# Patient Record
Sex: Female | Born: 1935 | Race: White | Hispanic: No | Smoking: Never smoker
Health system: Southern US, Community
[De-identification: ages and names within clinical notes are randomized; demographics above are authoritative.]

## PROBLEM LIST (undated history)

## (undated) DIAGNOSIS — E7800 Pure hypercholesterolemia, unspecified: Secondary | ICD-10-CM

## (undated) DIAGNOSIS — I10 Essential (primary) hypertension: Secondary | ICD-10-CM

## (undated) DIAGNOSIS — G4700 Insomnia, unspecified: Secondary | ICD-10-CM

## (undated) DIAGNOSIS — E785 Hyperlipidemia, unspecified: Secondary | ICD-10-CM

## (undated) DIAGNOSIS — R451 Restlessness and agitation: Secondary | ICD-10-CM

## (undated) DIAGNOSIS — F0280 Dementia in other diseases classified elsewhere without behavioral disturbance: Secondary | ICD-10-CM

## (undated) DIAGNOSIS — G3183 Dementia with Lewy bodies: Secondary | ICD-10-CM

## (undated) HISTORY — DX: Insomnia, unspecified: G47.00

## (undated) HISTORY — DX: Restlessness and agitation: R45.1

## (undated) HISTORY — DX: Dementia with Lewy bodies: G31.83

## (undated) HISTORY — DX: Hyperlipidemia, unspecified: E78.5

## (undated) HISTORY — DX: Dementia in other diseases classified elsewhere without behavioral disturbance: F02.80

## (undated) HISTORY — DX: Essential (primary) hypertension: I10

## (undated) HISTORY — DX: Pure hypercholesterolemia, unspecified: E78.00

---
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
## 2001-10-13 ENCOUNTER — Inpatient Hospital Stay (HOSPITAL_COMMUNITY): Admission: AD | Admit: 2001-10-13 | Discharge: 2001-10-16 | Payer: Self-pay | Admitting: Cardiology

## 2001-10-14 ENCOUNTER — Encounter: Payer: Self-pay | Admitting: Cardiology

## 2001-10-14 ENCOUNTER — Encounter: Payer: Self-pay | Admitting: Internal Medicine

## 2001-10-16 ENCOUNTER — Encounter: Payer: Self-pay | Admitting: Cardiology

## 2002-08-27 HISTORY — PX: REPLACEMENT TOTAL KNEE: SUR1224

## 2003-03-25 ENCOUNTER — Other Ambulatory Visit: Admission: RE | Admit: 2003-03-25 | Discharge: 2003-03-25 | Payer: Self-pay | Admitting: Dermatology

## 2003-06-01 ENCOUNTER — Ambulatory Visit (HOSPITAL_COMMUNITY): Admission: RE | Admit: 2003-06-01 | Discharge: 2003-06-01 | Payer: Self-pay | Admitting: Internal Medicine

## 2004-05-26 ENCOUNTER — Encounter: Admission: RE | Admit: 2004-05-26 | Discharge: 2004-05-26 | Payer: Self-pay | Admitting: Cardiology

## 2004-09-07 ENCOUNTER — Ambulatory Visit (HOSPITAL_COMMUNITY): Admission: RE | Admit: 2004-09-07 | Discharge: 2004-09-07 | Payer: Self-pay | Admitting: Urology

## 2005-05-24 ENCOUNTER — Encounter: Admission: RE | Admit: 2005-05-24 | Discharge: 2005-05-24 | Payer: Self-pay | Admitting: Cardiology

## 2006-05-24 ENCOUNTER — Encounter: Admission: RE | Admit: 2006-05-24 | Discharge: 2006-05-24 | Payer: Self-pay | Admitting: Cardiology

## 2007-07-14 ENCOUNTER — Encounter: Admission: RE | Admit: 2007-07-14 | Discharge: 2007-07-14 | Payer: Self-pay | Admitting: Cardiology

## 2009-06-07 ENCOUNTER — Encounter: Admission: RE | Admit: 2009-06-07 | Discharge: 2009-06-07 | Payer: Self-pay | Admitting: Cardiology

## 2009-08-27 HISTORY — PX: TOTAL SHOULDER REPLACEMENT: SUR1217

## 2010-08-27 HISTORY — PX: PACEMAKER INSERTION: SHX728

## 2010-08-27 HISTORY — PX: CATARACT EXTRACTION: SUR2

## 2010-09-07 ENCOUNTER — Encounter: Payer: Self-pay | Admitting: Internal Medicine

## 2010-09-11 ENCOUNTER — Encounter: Payer: Self-pay | Admitting: Internal Medicine

## 2010-09-12 ENCOUNTER — Encounter: Payer: Self-pay | Admitting: Internal Medicine

## 2010-09-14 ENCOUNTER — Encounter: Payer: Self-pay | Admitting: Internal Medicine

## 2010-09-14 ENCOUNTER — Ambulatory Visit (HOSPITAL_COMMUNITY)
Admission: RE | Admit: 2010-09-14 | Discharge: 2010-09-14 | Payer: Self-pay | Source: Home / Self Care | Attending: Internal Medicine | Admitting: Internal Medicine

## 2010-09-18 LAB — CBC
HCT: 39.2 % (ref 36.0–46.0)
Hemoglobin: 12.1 g/dL (ref 12.0–15.0)
MCH: 28 pg (ref 26.0–34.0)
MCHC: 30.9 g/dL (ref 30.0–36.0)

## 2010-09-18 LAB — BASIC METABOLIC PANEL
BUN: 9 mg/dL (ref 6–23)
Chloride: 105 mEq/L (ref 96–112)
Glucose, Bld: 104 mg/dL — ABNORMAL HIGH (ref 70–99)
Potassium: 4.2 mEq/L (ref 3.5–5.1)

## 2010-09-18 LAB — PROTIME-INR: INR: 1.06 (ref 0.00–1.49)

## 2010-09-28 NOTE — Cardiovascular Report (Signed)
Summary: Office Visit   Office Visit   Imported By: Roderic Ovens 09/18/2010 11:14:06  _____________________________________________________________________  External Attachment:    Type:   Image     Comment:   External Document

## 2010-10-04 NOTE — Letter (Signed)
Summary: Dr. Leeroy Bock Tilley's Office Note  Dr. Leeroy Bock Tilley's Office Note   Imported By: Marylou Mccoy 09/27/2010 11:32:17  _____________________________________________________________________  External Attachment:    Type:   Image     Comment:   External Document

## 2010-10-16 NOTE — Op Note (Signed)
  Amanda Rivera, Amanda Rivera                 ACCOUNT NO.:  [id number]  MEDICAL RECORD NO.:  [id number]          PATIENT TYPE:  OIB  LOCATION:  2899                         FACILITY:  [facility]  PHYSICIAN:  Duke Salvia, MD, FACCDATE OF BIRTH:  [birth year]  DATE OF PROCEDURE:  09/14/2010 DATE OF DISCHARGE:  09/14/2010                              OPERATIVE REPORT   PREOPERATIVE DIAGNOSIS:  Previously implanted pacemaker for syncope and sinus node dysfunction, now at end-of-life.  POSTOPERATIVE DIAGNOSIS:  Previously implanted pacemaker for syncope and sinus node dysfunction, now at end-of-life.  PROCEDURE:  Explantation of previously implanted device, pocket revision, insertion of a new device.  Following obtaining the informed consent, the patient was brought to the Electrophysiology Laboratory and placed on the fluoroscopic table in supine position.  After routine prep and drape, lidocaine was infiltrated just below the previous incision and carried down to the layer of the prepectoral fascia using electrocautery and sharp dissection.  The pocket was opened.  The device was freed up and device was explanted.  Interrogation of the previously implanted atrial lead demonstrated an amplitude of 2.3 with a pace impedance of 40, threshold of 0.4 at 0.5.  The R-wave had an amplitude of 12.5 with a pace impedance of 501, a threshold of 0.9 at 0.5.  The leads were inspected. The leads were then attached to a Adapta RL pulse generator, serial number [id number].  The device was then placed in the pocket and secured to the prepectoral fascia.  Hemostasis having been obtained and the pocket having been extended caudally by about a centimeter and a half to allow housing of the larger generator.  The pocket was copiously irrigated with antibiotic-containing saline solution.  Hemostasis was assured, and the wound was closed in three layers in normal fashion. The wound was washed, dried, and Benzoin and  Steri-Strip dressing was applied.  Needle counts, sponge counts, and instrument counts were correct at the end of the procedure according to the staff.  The patient tolerated the procedure without apparent complication.     Duke Salvia, MD, [facility]     SCK/MEDQ  D:  09/14/2010  T:  09/15/2010  Job:  [id number]  Electronically Signed by Sherryl Manges MD [facility] on 10/16/2010 09:50:22 PM

## 2010-10-18 NOTE — Cardiovascular Report (Signed)
Summary: Pre-Op Orders  Pre-Op Orders   Imported By: Marylou Mccoy 10/10/2010 14:49:15  _____________________________________________________________________  External Attachment:    Type:   Image     Comment:   External Document

## 2011-01-10 ENCOUNTER — Encounter: Payer: Self-pay | Admitting: *Deleted

## 2011-01-12 NOTE — Cardiovascular Report (Signed)
NAMEMATTISEN, POHLMANN                             ACCOUNT NO.:  [id number]   MEDICAL RECORD NO.:  [id number]                   PATIENT TYPE:  INP   LOCATION:                                       FACILITY:  [facility]   PHYSICIAN:  Learta Codding, M.D.                 DATE OF BIRTH:  26-Feb-1921   DATE OF PROCEDURE:  DATE OF DISCHARGE:  10/16/2001                              CARDIAC CATHETERIZATION   PRIMARY CARE PHYSICIAN:  Dr. Doreen Beam.   PROCEDURE PERFORMED:  Left heart catheterization, ventriculography.   INDICATION:  The patient is a 77 year old female referred for cardiac  catheterization to rule out significant coronary artery disease.   DESCRIPTION OF PROCEDURE:  Informed consent was obtained.  The patient was  brought to the catheterization laboratory.  6-French arterial catheters were  used to access right femoral artery using the modified Seldinger technique.  No complications were encountered during the procedure.   FINDINGS:   HEMODYNAMICS:  Left ventricular pressure 150/55 mmHg, aortic pressure 120/55  mmHg, gradient across the aortic valve mean of 12 mmHg with peak gradient of  20 mmHg.   VENTRICULOGRAPHY:  Ejection fraction 70%.  No wall motion abnormalities.   SELECTIVE CORONARY ANGIOGRAPHY:  1. Left main coronary artery:  No evidence of flow-liming disease.  2. Circulation was right dominant.  3. Left anterior descending artery had a proximal 30% diffuse stenosis.     There was a lesion of approximately 40% in the mid vessel and a distal     40% lesion.  4. Diagonal branches were free of flow-limiting disease.  5. Ramus branch had proximal and ostial 30% lesion.  6. Circumflex was diffusely diseased in its proximal segment with     significant stenosis of approximately 20%.  7. The right coronary artery had no evidence of flow-limiting disease.   RECOMMENDATIONS:  The patient demonstrates nonobstructive coronary artery  disease.  Further medical therapy is  recommended.  A CBC will be obtained  later this evening to rule out retroperitoneal hematoma.  No complications  otherwise encountered.                                               Learta Codding, M.D.    GED/MEDQ  D:  09/12/2003  T:  09/12/2003  Job:  [id number]

## 2011-01-12 NOTE — Op Note (Signed)
[place]  Patient:    Amanda Rivera, Amanda Rivera Visit Number: [id number] MRN: [id number]          Service Type: MED Location: CCUA 2931 01 Attending Physician:  Ronaldo Miyamoto Dictated by:   Nathen May, M.D., [facility] Proc. Date: 10/15/01 Admit Date:  10/13/2001   CC:         Dr. Oleh Genin  [facility] - Attention:  Device Clinic  Electrophysiology Laboratory   Operative Report  PREOPERATIVE DIAGNOSIS:  Sinus node dysfunction with asystolic arrest and syncope.  POSTOPERATIVE DIAGNOSIS:  Sinus node dysfunction with asystolic arrest and syncope.  PROCEDURE:  Contrast venography and dual chamber pacemaker implantation.  SURGEON:  Nathen May, M.D., [facility].  DESCRIPTION OF PROCEDURE:  Following obtaining informed consent, the patient was brought to the electrophysiology laboratory, and placed on the fluoroscopic table in the supine position.  After routine prep and drape to the left upper chest, intravenous contrast was injected via the left antecubital vein to identify the course and the patency to the extrathoracic left subclavian vein.  This having been accomplished, lidocaine was infiltrated into the prepectoral subclavicular region.  An incision was made and carried down to the layer of the prepectoral fascia using electrocautery.  A pocket was formed using electrocautery.  Hemostasis was obtained.  Thereafter, attention was turned to gaining access to the extrathoracic left subclavian vein which was accomplished without difficulty, and without the aspiration of air or puncture to the artery.  Two guidewires were placed and a retaining 0 silk suture was placed in a figure-of-eight fashion and allowed to hang loosely.  Sequentially, a 7-French tear-away introducer sheaths were placed which allowed for the passage of two Medtronic active fixation leads, the first was a model 5076, 52 cm in  length, marked with a tie, serial #[id number].  Under fluoroscopic guidance, it was manipulated to the right ventricular apex where the bipolar R-wave was 13.9 mV with a pacing threshold of 0.6 V at 0.5 msec. Currented threshold was 1.0 mA and a pacing impedance was 817 ohms.  There was no diaphragmatic pacing at 10 V.  This lead was then secured to the prepectoral fascia and the Medtronic 5076, 45 cm length lead, serial #[id number] was then passed under fluoroscopic guidance to the right atrial appendage where the bipolar P-wave was 2.2 mV and pacing threshold was 1.6 V at 0.5 msec.  Currented threshold was 3.3 mA and pacing impedance was 616 ohms.  This lead was then secured to the prepectoral fascia as well, and the hemostasis surgically was secured.  The leads were then attached to Medtronic Kappa KDR 801 pulse generator, serial #[id number]. Ventricular and then A-V pacing were identified.  Hemostasis was obtained and the pocket was copiously irrigated with antibiotic containing saline solution. The leads in the pulse generator were then placed in the pocket and secured to the prepectoral fascia.  The wound was then closed in three layers in the normal fashion.  The wound was washed, dried, and a Benzoin and Steri-Strips dressing was then applied. Needle counts, sponge counts, and instruments counts were correct at the end of the procedure according to the staff. Dictated by:   Nathen May, M.D., [facility] Attending Physician:  Ronaldo Miyamoto DD:  10/15/01 TD:  10/15/01 Job: [id number]

## 2011-01-12 NOTE — Op Note (Signed)
NAMEESTEFANIA, [name]                 ACCOUNT NO.:  [id number]   MEDICAL RECORD NO.:  [id number]          PATIENT TYPE:  AMB   LOCATION:  DAY                          FACILITY:  [facility]   PHYSICIAN:  Thyra Breed, MD         DATE OF BIRTH:  Aug 01, 1921   DATE OF PROCEDURE:  09/07/2004  DATE OF DISCHARGE:                                 OPERATIVE REPORT   PREOPERATIVE DIAGNOSIS:  Stress urinary incontinence.   POSTOPERATIVE DIAGNOSIS:  Stress urinary incontinence.   PROCEDURE PERFORMED:  1.  Transobturator sling placement Conservation officer, historic buildings).  2.  Cystoscopy.   ATTENDING SURGEON:  Sigmund I. Patsi Sears, M.D.   RESIDENT SURGEON:  Thyra Breed, M.D.   ANESTHESIA:  General endotracheal.   DRAINS:  16-French Foley catheter to straight drainage.   ESTIMATED BLOOD LOSS:  Minimal.   COMPLICATIONS:  None.   INDICATIONS FOR PROCEDURE:  [name] is a pleasant 77 year old female who  suffers from stress urinary incontinence. Specifically, she uses urine with  coughing, laughing and sneezing. On physical exam, she has a mild cystocele  with significant urethral hypermobility. Several options for treatment were  discussed with Ms. Rivera, and she wishes to proceed with surgical  intervention. Therefore, she was brought to the operating room to undergo  transobturator sling placement. All risks, benefits, and alternatives had  been described in detail, and the patient is willing to proceed. Informed  consent was obtained.   PROCEDURE IN DETAIL:  Following identification by her arm bracelet, the  patient was brought to the operating room and placed in a supine position.  Here, she received preoperative IV antibiotics and underwent successful  induction of general endotracheal anesthesia. She was then moved to the  dorsal lithotomy position. Her perineum and genitalia as well as vaginal  vault were then prepped with Betadine and draped in the usual sterile  fashion. We placed a weighted vaginal  speculum in the vagina revealing the  anterior vaginal vault. There was a questionable grade 1 enterocele versus  cystocele deep in the anterior vaginal vault. We placed a 16-French Foley  catheter to straight drainage and emptied the bladder in its entirety. A T  clamp was used to grasp the distal aspect of the urethra. Of note, her  posterior lip of her urethra did have a small caruncle. The 11-blade scalpel  was then use to make an incision. Marcaine 0.25% with epinephrine was then  used to inject the anterior vaginal mucosa prior to incision. An 11-blade  scalpel was then used to create an approximately 2-cm incision in the  midline just overlying the urethra. Strully scissors and pickups were then  used to carefully dissect a plane far lateral to the urethra on either side  of the midline. The resulting opening was just large enough to fit the small  finger of the surgeon. The pubic bone was palpable easily on either side of  the urethra.  We then used a ruler to mark 5 cm from the clitoris on either side of the  midline in the area of the obturator  fossa. Two stab incisions were then  made with the 15-blade scalpel. The curved needle passer was then passed  through both stab incisions far lateral to the urethra with a surgeon's  finger guiding the needle through the incision made in the anterior vaginal  mucosa. The sling was affixed to the needle passers, and the needle passers  brought back through the stab incisions. At this time, the Foley catheter  was removed. We performed cystoscopy to ensure there was damage to the  bladder during passage of the needles. Using a 70-degree cystoscopic lens  inserted transurethrally, there appeared to be no damage to the urethra as  it was normal in its appearance. Both ureteral orifices were identified in  the normal anatomic location. Further inspection displayed some indention of  the anterior bladder wall, most likely from uterine origin.  There was no  evidence of mucosa irregularities, foreign bodies, tumors or stones within  the bladder. The cystoscope was then removed and drained in its entirety.  The Foley catheter was reinserted into the bladder. The sling was then  tightened as a right angled clamp was left to ensure the sling was not too  tight. Once we were pleased with the tension, the outer sheath of the sling  was removed. Excess sling material was trimmed above the stab incisions. The  right inguinal clamp was removed. The wound was copiously irrigated with  antibiotic solution. Three-0 Vicryl was then used to close the anterior  vaginal wall incision over the sling. Foley catheter was left in place. The  stab incisions were washed, dried, and Dermabond applied to complete the  closure. The patient tolerated the procedure well, and there were no  complications.   Dr. Lynelle Smoke I. Patsi Sears was present and participated in the entire  procedure as he was the responsible Careers adviser.   DISPOSITION:  After awakening from general anesthesia, the patient was  transferred to the post-anesthesia care unit in stable condition. From here,  her Foley catheter would be removed, and she will be given a trial of  voiding. She can then be discharged to home. She is given a prescription for  Percocet #30 as well as a one week course of Cipro 250 b.i.d.      EG/MEDQ  D:  09/07/2004  T:  09/07/2004  Job:  [id number]
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
## 2011-01-12 NOTE — Op Note (Signed)
NAMEKENNIDI, YOSHIDA                             ACCOUNT NO.:  [id number]   MEDICAL RECORD NO.:  [id number]                   PATIENT TYPE:  AMB   LOCATION:  DAY                                  FACILITY:  [facility]   PHYSICIAN:  Lionel December, M.D.                 DATE OF BIRTH:  18-Aug-1921   DATE OF PROCEDURE:  06/01/2003  DATE OF DISCHARGE:                                 OPERATIVE REPORT   PROCEDURE:  Total colonoscopy.   ENDOSCOPIST:  Lionel December, M.D.   INDICATIONS:  Amanda Rivera is an 77 year old Caucasian female who is undergoing  colonoscopy primarily for screening purposes but she does complain of recent  onset of constipation and intermittent right lower quadrant pain.  She has  not had any rectal bleeding.  The procedure was reviewed with the patient.  Informed consent was obtained.   PREOPERATIVE MEDICATIONS:  Demerol 25 mg IV, Versed 2 mg IV in divided  doses.   DESCRIPTION OF PROCEDURE:  The procedure was performed in the endoscopy  suite.  The patient's vital signs and O2 saturation were monitored during  the procedure and remained stable.  The patient was placed in the left  lateral recumbent position.  Rectal examination was performed.  No  abnormality noted on external digital examination.   The scope was placed in the rectum and advanced under vision into the  sigmoid colon and beyond.  Preparation was excellent until the hepatic  flexure and she has some stool coating her cecum and ascending colon.  She  has scattered diverticula of the sigmoid colon and two tiny runs at the  hepatic flexure.  The scope was passed to the cecum which was identified by  the ileocecal valve.  The blunt end of the cecum was well seen and was  normal.  The appendiceal stump was also seen and photographed for the  record.  As the scope was withdrawn, colonic mucosa was once again carefully  examined.  There were no polyps, tumor masses or stricture.  The perirectal  mucosa was normal.   The scope was retroflexed to examine the anorectal junction which was  unremarkable.  The endoscope was straightened and withdrawn.   The patient tolerated the procedure well.   FINAL DIAGNOSIS:  Sigmoid colon diverticulosis along with a few more  diverticula at the hepatic flexure; otherwise, normal examination.    RECOMMENDATIONS:  She will continue a high-fiber diet and try Colace two  tablets at bedtime or Lactulose 1-2 tablespoonfuls at bedtime.  However, I  would also like her to have a TSH by Dr. Sherril Croon unless she has had it recently  along with serum calcium.      ___________________________________________  Lionel December, M.D.   NR/MEDQ  D:  06/01/2003  T:  06/02/2003  Job:  [id number]   cc:   Ignatius Specking, M.D.

## 2011-08-28 HISTORY — PX: BLADDER SURGERY: SHX569

## 2012-05-17 DIAGNOSIS — R0602 Shortness of breath: Secondary | ICD-10-CM

## 2012-07-18 IMAGING — CR DG CHEST 2V
2 series · 2 of 2 positions shown · non-contrast
Comparison: 06/07/2009

CLINICAL DATA: Pacemaker generator change.

CHEST - 2 VIEW

[view not recorded (1 of 2)]
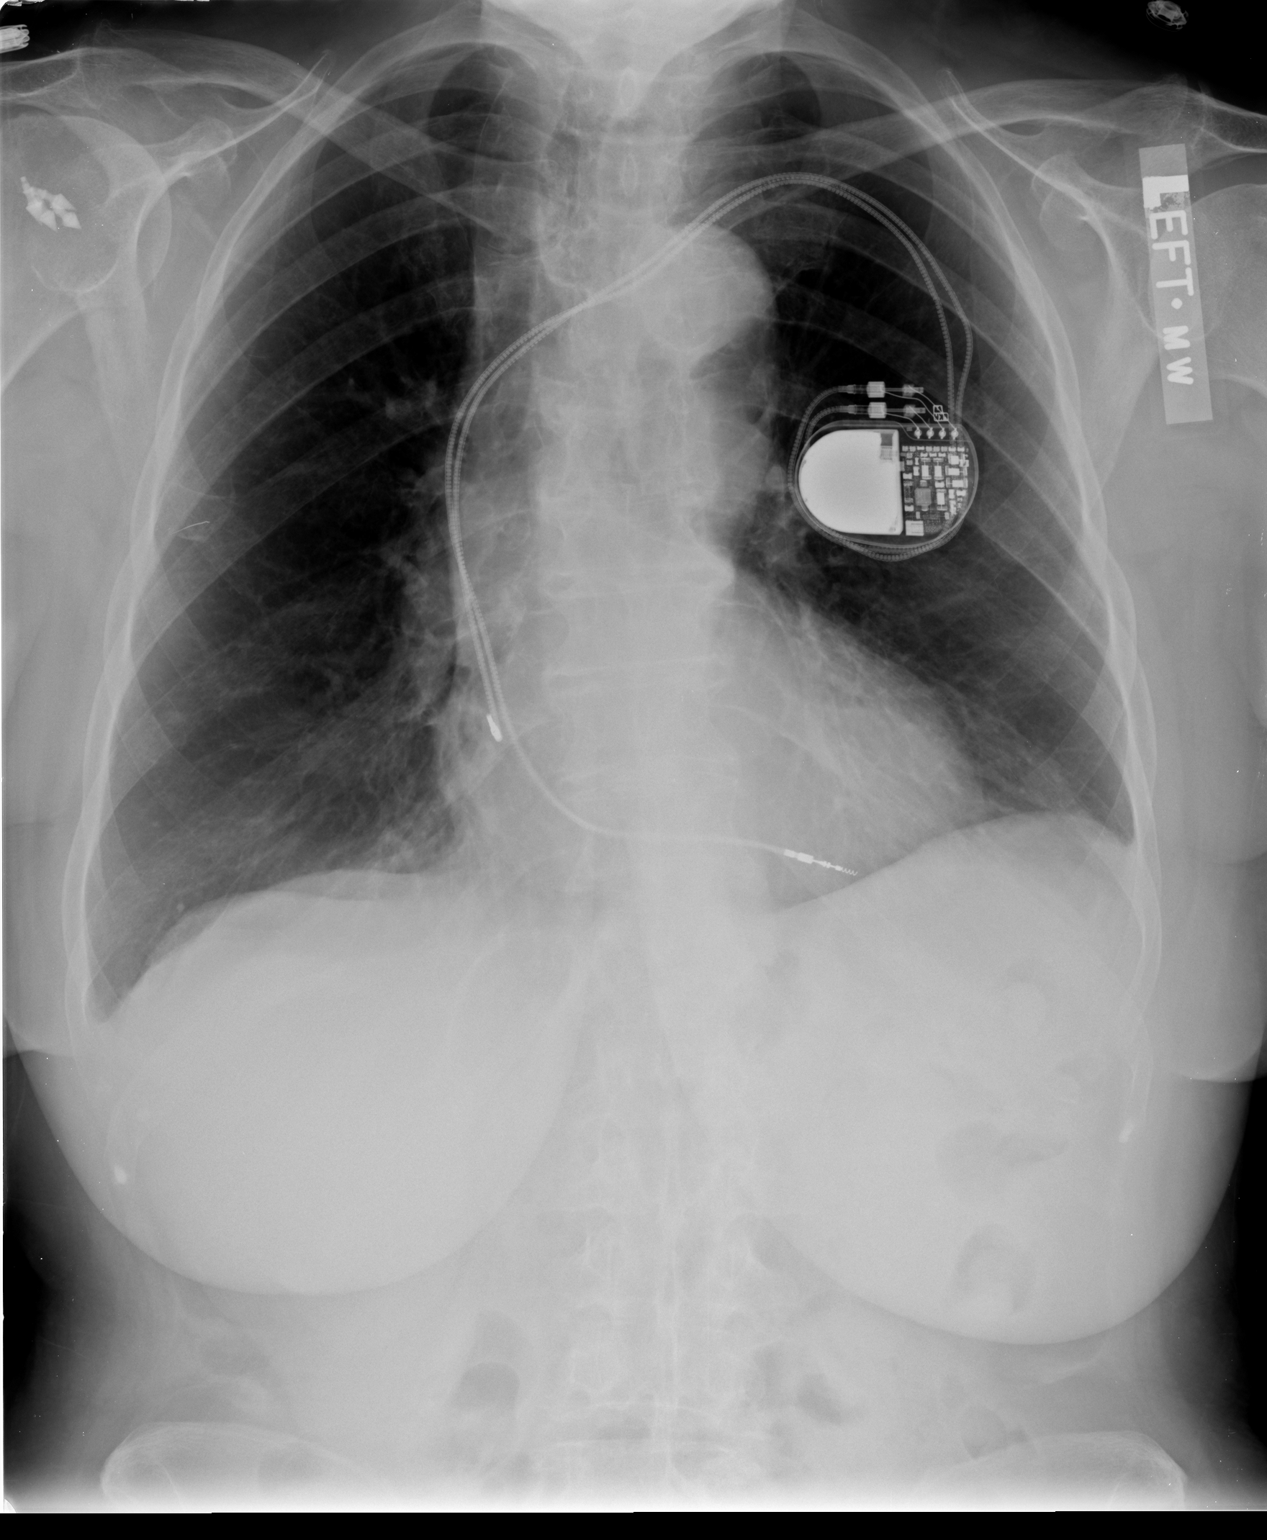

[view not recorded (2 of 2)]
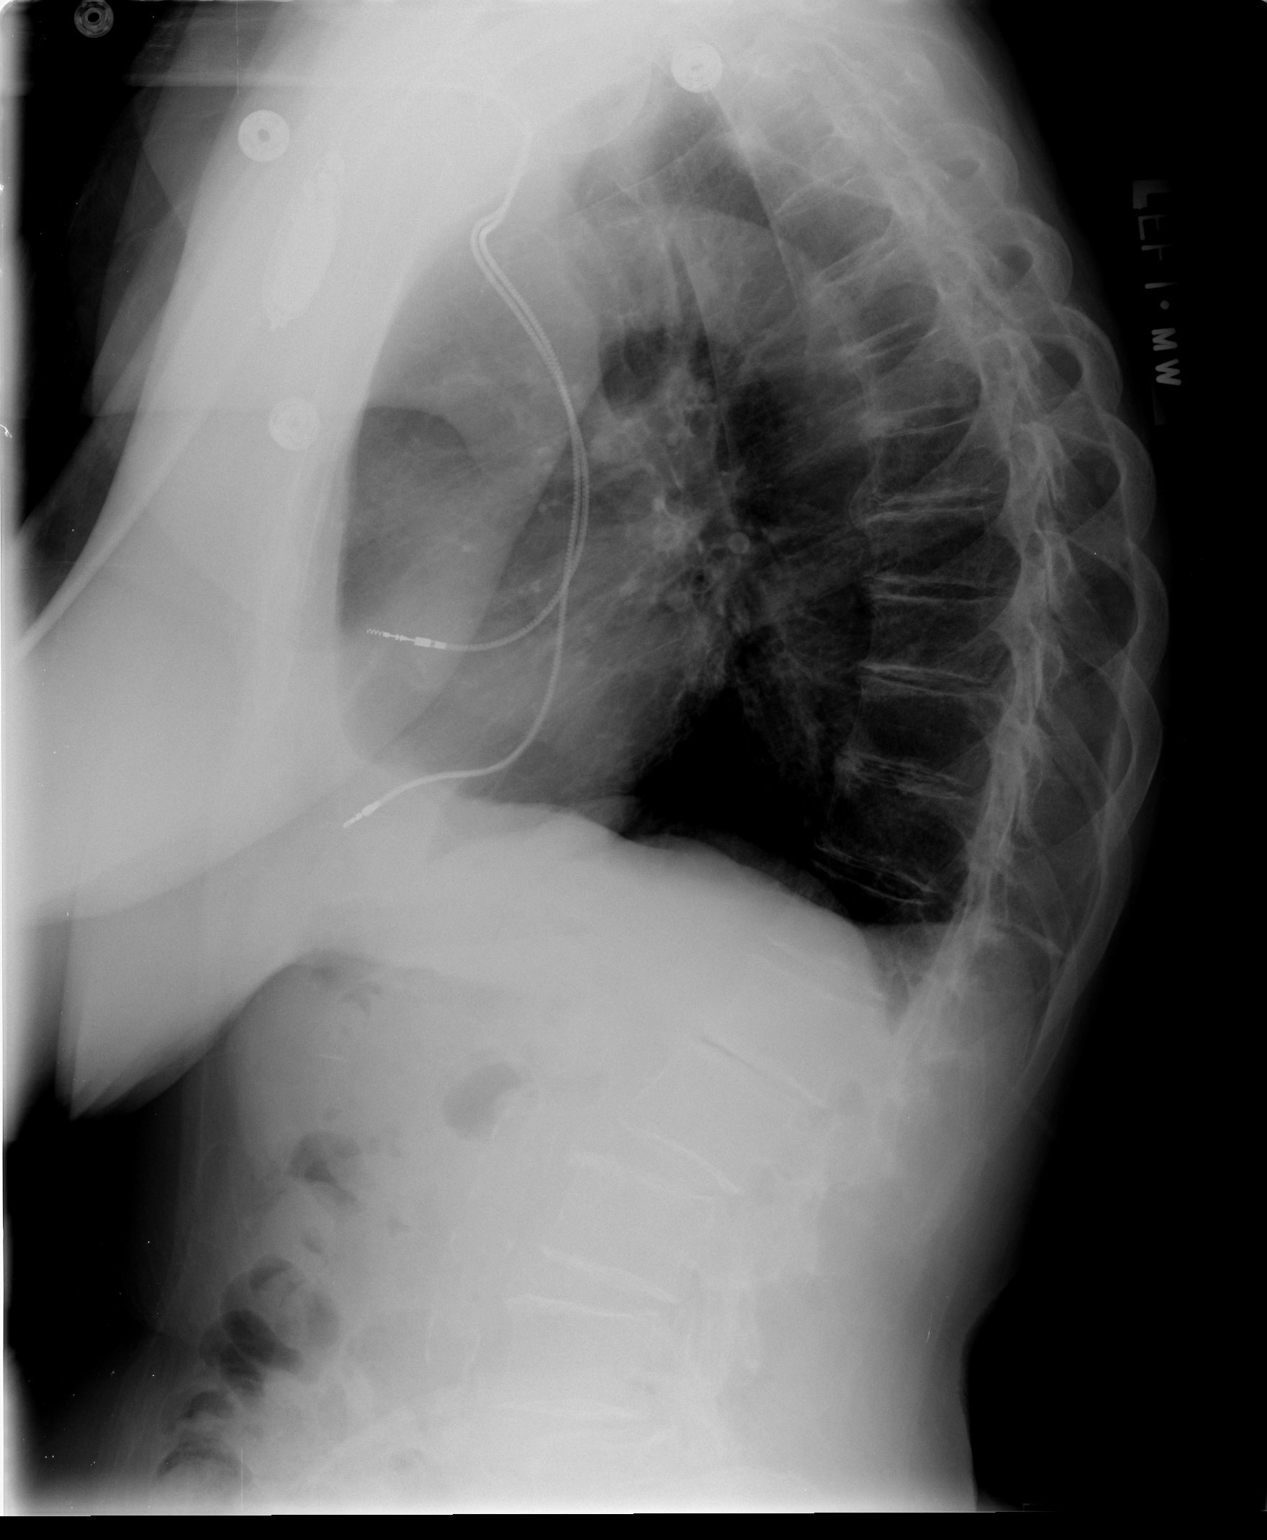

[2 of 2 positions shown; findings below may reference images not displayed]

FINDINGS: Dual lead pacer is in place.  Heart size and vascularity
are normal.  Lungs are clear. Except for minimal scarring at the
bases.  No acute osseous abnormality.
IMPRESSION: No acute abnormalities.

## 2012-08-27 HISTORY — PX: ANKLE SURGERY: SHX546

## 2014-08-06 ENCOUNTER — Encounter: Payer: Self-pay | Admitting: *Deleted

## 2014-08-06 ENCOUNTER — Encounter: Payer: Self-pay | Admitting: Internal Medicine

## 2014-08-06 ENCOUNTER — Ambulatory Visit (INDEPENDENT_AMBULATORY_CARE_PROVIDER_SITE_OTHER): Payer: Medicare Other | Admitting: Internal Medicine

## 2014-08-06 VITALS — BP 120/70 | HR 66 | Temp 97.6°F | Resp 18

## 2014-08-06 DIAGNOSIS — G4700 Insomnia, unspecified: Secondary | ICD-10-CM

## 2014-08-06 DIAGNOSIS — F0280 Dementia in other diseases classified elsewhere without behavioral disturbance: Secondary | ICD-10-CM

## 2014-08-06 DIAGNOSIS — Z950 Presence of cardiac pacemaker: Secondary | ICD-10-CM

## 2014-08-06 DIAGNOSIS — E785 Hyperlipidemia, unspecified: Secondary | ICD-10-CM

## 2014-08-06 DIAGNOSIS — R451 Restlessness and agitation: Secondary | ICD-10-CM

## 2014-08-06 DIAGNOSIS — G3183 Dementia with Lewy bodies: Secondary | ICD-10-CM

## 2014-08-06 HISTORY — DX: Restlessness and agitation: R45.1

## 2014-08-06 HISTORY — DX: Neurocognitive disorder with Lewy bodies: G31.83

## 2014-08-06 HISTORY — DX: Hyperlipidemia, unspecified: E78.5

## 2014-08-06 HISTORY — DX: Insomnia, unspecified: G47.00

## 2014-08-06 HISTORY — DX: Dementia in other diseases classified elsewhere, unspecified severity, without behavioral disturbance, psychotic disturbance, mood disturbance, and anxiety: F02.80

## 2014-08-06 MED ORDER — MIRTAZAPINE 7.5 MG PO TABS: 7.5000 mg | ORAL_TABLET | Freq: Every day | ORAL | Status: DC

## 2014-08-06 NOTE — Patient Instructions (Signed)
alzheimer

## 2014-08-06 NOTE — Progress Notes (Signed)
Subjective:    Patient ID: Amanda Rivera, female    DOB: 1921/03/31, 80 y.o.   MRN: [id number]  HPI  80 yo female presents for new patient visit. POA, her daughter, is c/a medications. Hx Alzheimer's on no meds and has tried "everything". She has agitation that is sometimes "really bad". Occasionally has to crush meds into food. She does not sleep well with frequent night awakenings. Occasionally gives her an extra dose of doxepin and clonopin which helps. She has visual hallucinations and screams a lot when she does not see her daughter. Pt lives with daughter and son-in-law.  Past  meds tried:   - Zolpidem,  temazepam, trazadone, lorazepam for sleep with no relief.  - Alprazolam, haloperidol, risperdol, requip, seroquel for agitation with no relief.  - Namenda (not helpful) and aricept (caused nausea) for dementia. Daughter recalls seeing a specialist in the past but does not remember if it was a neurologist.  Pt uses hospital bed. Sleeps with electric blanket. Takes sponge bath nightly and a bath twice weekly. Applies monkey paste in skin folds to prevent yeast. Rarely needs nystatin powder.Daughter applies nystatin soln to corner of pt's mouth at night as she sleeps with her mouth open and it causes dryness. Requires much assistance with ambulation. Sits in wheelchair or chair most of the day.    Review of Systems  Constitutional: Negative for fever, chills, activity change, appetite change and unexpected weight change.  HENT: Negative for hearing loss, mouth sores and trouble swallowing.   Eyes: Negative for visual disturbance.  Respiratory: Negative for shortness of breath.   Cardiovascular: Negative for chest pain and leg swelling.  Gastrointestinal: Negative for vomiting, abdominal pain and blood in stool.  Genitourinary: Negative for dysuria and frequency.  Skin: Negative for rash.  Neurological: Negative for tremors, seizures and weakness.  Psychiatric/Behavioral: Positive for  hallucinations, confusion, sleep disturbance and agitation.       Objective:   Physical Exam  Constitutional: She appears well-nourished. No distress.  frail appearing, sitting in w/c  Eyes: Pupils are equal, round, and reactive to light.  Neck: Carotid bruit is not present. No thyromegaly present.  Cardiovascular: Normal rate, regular rhythm and intact distal pulses.  Exam reveals no gallop and no friction rub.   No murmur heard. Palpable left ACW pacer  Pulmonary/Chest: Effort normal and breath sounds normal. No respiratory distress. She has no wheezes. She has no rales.  Abdominal: Soft. Bowel sounds are normal. She exhibits no distension. There is no tenderness. There is no rebound and no guarding.  Lymphadenopathy:    She has no cervical adenopathy.  Neurological: She is alert.  Skin: Skin is warm and dry. No rash noted.  Psychiatric: Her mood appears anxious. Her speech is slurred. She is agitated. Cognition and memory are impaired.  Oriented to person only  Vitals reviewed.         Assessment & Plan:     ICD-9-CM ICD-10-CM   1. Dementia with Lewy bodies 331.82 G31.83 CMP    F02.80 CBC with Differential  2. Insomnia 780.52 G47.00 mirtazapine (REMERON) 7.5 MG tablet  3. Restlessness and agitation 799.29 R45.1 CMP   307.9  CBC with Differential  4. Pacemaker V45.01 Z95.0   5. Hyperlipidemia 272.4 E78.5 Lipid Panel    Orders Placed This Encounter  Procedures  . CMP  . CBC with Differential  . Lipid Panel   - trial remeron 7.5 mg qhs to help sleep. Continue prn clonazepam for agitation. STOP  doxepin. POA declines med for dementia  - f/u in 1 month to reck or sooner if need be.  - obtain old records from previous PCP. Dr Donnie Ahoilley is her cardiologist and mx pacer.  - she is UTD on immunizations  - continue all other meds as Rx

## 2014-08-07 LAB — CBC WITH DIFFERENTIAL/PLATELET
BASOS ABS: 0 10*3/uL (ref 0.0–0.2)
Basos: 0 %
EOS ABS: 0.1 10*3/uL (ref 0.0–0.4)
Eos: 3 %
HCT: 39.2 % (ref 34.0–46.6)
Hemoglobin: 12.7 g/dL (ref 11.1–15.9)
IMMATURE GRANS (ABS): 0 10*3/uL (ref 0.0–0.1)
IMMATURE GRANULOCYTES: 0 %
LYMPHS: 23 %
Lymphocytes Absolute: 1.2 10*3/uL (ref 0.7–3.1)
MCH: 29.5 pg (ref 26.6–33.0)
MCHC: 32.4 g/dL (ref 31.5–35.7)
MCV: 91 fL (ref 79–97)
MONOCYTES: 6 %
Monocytes Absolute: 0.3 10*3/uL (ref 0.1–0.9)
NEUTROS PCT: 68 %
Neutrophils Absolute: 3.7 10*3/uL (ref 1.4–7.0)
RBC: 4.31 x10E6/uL (ref 3.77–5.28)
RDW: 14.6 % (ref 12.3–15.4)
WBC: 5.3 10*3/uL (ref 3.4–10.8)

## 2014-08-07 LAB — LIPID PANEL
CHOLESTEROL TOTAL: 119 mg/dL (ref 100–199)
Chol/HDL Ratio: 3.1 ratio units (ref 0.0–4.4)
HDL: 38 mg/dL — ABNORMAL LOW (ref >39)
LDL CALC: 70 mg/dL (ref 0–99)
Triglycerides: 54 mg/dL (ref 0–149)
VLDL Cholesterol Cal: 11 mg/dL (ref 5–40)

## 2014-08-07 LAB — COMPREHENSIVE METABOLIC PANEL
A/G RATIO: 1.6 (ref 1.1–2.5)
ALK PHOS: 116 IU/L (ref 39–117)
ALT: 13 IU/L (ref 0–32)
AST: 19 IU/L (ref 0–40)
Albumin: 3.8 g/dL (ref 3.2–4.6)
BUN / CREAT RATIO: 25 (ref 11–26)
BUN: 22 mg/dL (ref 10–36)
CO2: 23 mmol/L (ref 18–29)
CREATININE: 0.88 mg/dL (ref 0.57–1.00)
Calcium: 9 mg/dL (ref 8.7–10.3)
Chloride: 107 mmol/L (ref 97–108)
GFR calc Af Amer: 66 mL/min/{1.73_m2} (ref >59)
GFR, EST NON AFRICAN AMERICAN: 58 mL/min/{1.73_m2} — AB (ref >59)
GLOBULIN, TOTAL: 2.4 g/dL (ref 1.5–4.5)
Glucose: 111 mg/dL — ABNORMAL HIGH (ref 65–99)
POTASSIUM: 4.1 mmol/L (ref 3.5–5.2)
SODIUM: 146 mmol/L — AB (ref 134–144)
Total Bilirubin: 0.2 mg/dL (ref 0.0–1.2)
Total Protein: 6.2 g/dL (ref 6.0–8.5)

## 2014-08-11 ENCOUNTER — Telehealth: Payer: Self-pay | Admitting: *Deleted

## 2014-08-11 MED ORDER — MIRTAZAPINE 15 MG PO TABS: 15.0000 mg | ORAL_TABLET | Freq: Every day | ORAL | Status: DC

## 2014-08-11 NOTE — Telephone Encounter (Signed)
Caregiver also stated that last night gave her 2 anxiety pills at 4:30am this morning and still has not slept. They have tried Ambien in the past and it didn't work.

## 2014-08-11 NOTE — Telephone Encounter (Signed)
Patient caregiver has 15mg  tablets and will give her a whole tablet at bedtime. She will let us know how this works.

## 2014-08-11 NOTE — Telephone Encounter (Signed)
Patient caregiver, [name] called and stated that she has been patient 1/2 of the tablet you gave her to help patient sleep and it is not working. Has been giving it to her for the last 3 days and patient is still staying up [place] and talking when she is suppose to be sleeping. She wants to know if she can go to a whole tablet and see if that would work because she is exhausted. Please Advise.

## 2014-08-11 NOTE — Telephone Encounter (Signed)
Does she have a 15 mg tablet that she is cutting in half or does she have the 7.5mg  tablet? If she has the 7.5 mg tablet, then take the whole tablet as originally Rx. If she has the 15mg  tablet, it is ok for to take the whole tablet as well. Continue prn clonazepam for agitation

## 2014-08-16 ENCOUNTER — Telehealth: Payer: Self-pay | Admitting: *Deleted

## 2014-08-16 NOTE — Telephone Encounter (Signed)
Patient Caregiver, Amanda Rivera called and stated that patient has been taking Mirtazapine 15mg  since 12/16 and it is not working. Patient still isn't sleeping well. Please Advise.

## 2014-08-16 NOTE — Telephone Encounter (Signed)
Increase remeron to 30mg  #30 take 1 po qhs to help sleep with 2 RF. Call if no response.

## 2014-08-17 MED ORDER — MIRTAZAPINE 30 MG PO TABS: 30.0000 mg | ORAL_TABLET | Freq: Every day | ORAL | Status: DC

## 2014-08-17 NOTE — Telephone Encounter (Signed)
Patient daughter notified and medication called into pharmacy.

## 2014-09-08 ENCOUNTER — Ambulatory Visit (INDEPENDENT_AMBULATORY_CARE_PROVIDER_SITE_OTHER): Payer: Medicare Other | Admitting: Internal Medicine

## 2014-09-08 ENCOUNTER — Encounter: Payer: Self-pay | Admitting: Internal Medicine

## 2014-09-08 VITALS — BP 130/80 | HR 63 | Resp 20

## 2014-09-08 DIAGNOSIS — F0280 Dementia in other diseases classified elsewhere without behavioral disturbance: Secondary | ICD-10-CM

## 2014-09-08 DIAGNOSIS — R451 Restlessness and agitation: Secondary | ICD-10-CM

## 2014-09-08 DIAGNOSIS — G4700 Insomnia, unspecified: Secondary | ICD-10-CM

## 2014-09-08 DIAGNOSIS — G3183 Dementia with Lewy bodies: Secondary | ICD-10-CM

## 2014-09-08 MED ORDER — FLUOXETINE HCL 40 MG PO CAPS: 40.0000 mg | ORAL_CAPSULE | Freq: Every day | ORAL | Status: DC

## 2014-09-08 MED ORDER — MIRTAZAPINE 45 MG PO TABS: 45.0000 mg | ORAL_TABLET | Freq: Every day | ORAL | Status: DC

## 2014-09-08 NOTE — Progress Notes (Signed)
Patient ID: Amanda Rivera, female   DOB: 09-Mar-1921, 81 y.o.   MRN: [id number]    Facility  [facility]    Place of Service:   OFFICE   Allergies  Allergen Reactions  . Bactrim [Sulfamethoxazole-Trimethoprim] Other (See Comments)    Mild dermatitis   . Codeine Other (See Comments)    Gets sick   . Levaquin [Levofloxacin] Other (See Comments)    Mild dermatitis     Chief Complaint  Patient presents with  . Medical Management of Chronic Issues    insomnia and agitation    HPI:   81 yo female seen today for f/u of insomnia/agitation. She is sleeping a little better but daughter still ends up giving her an additional 1/2 of remeron and 1/2 of clonopin in the middle of the night. She gets about 3 hrs of sleep prior to additional medicine. Pt cries easily when she does not get enough sleep.  Medications: Patient's Medications  New Prescriptions   No medications on file  Previous Medications   ACETAMINOPHEN (TYLENOL) 100 MG/ML SOLUTION    1 teaspoon as needed for arthritis, aches, and pain   CLONAZEPAM (KLONOPIN) 1 MG TABLET    Take 1 mg by mouth. 5 times daily as needed   FLUOXETINE (PROZAC) 20 MG CAPSULE    Take 20 mg by mouth daily.   METOPROLOL SUCCINATE (TOPROL-XL) 50 MG 24 HR TABLET    Take 50 mg by mouth 2 (two) times daily. Take with or immediately following a meal.   MIRTAZAPINE (REMERON) 30 MG TABLET    Take 1 tablet (30 mg total) by mouth at bedtime. For rest   NYSTATIN (NYAMYC) 100000 UNIT/GM POWD    Apply topically daily as needed.   ROSUVASTATIN (CRESTOR) 5 MG TABLET    Take 5 mg by mouth daily.  Modified Medications   No medications on file  Discontinued Medications   No medications on file     Review of Systems  As above. Pt poor historian due to dementia. Hx obtained from daughter   Ceasar Mons Vitals:   09/08/14 1449  BP: 130/80  Pulse: 63  Resp: 20  SpO2: 95%   There is no height or weight on file to calculate BMI.  Physical Exam CONSTITUTIONAL: Looks well  in NAD. Awake and alert PSYCH: appears anxious and confused. Easily distracted.  Mood is labile   Labs reviewed: Office Visit on 08/06/2014  Component Date Value Ref Range Status  . Glucose 08/06/2014 111* 65 - 99 mg/dL Final  . BUN 62/95/2841 22  10 - 36 mg/dL Final  . Creatinine, Ser 08/06/2014 0.88  0.57 - 1.00 mg/dL Final  . GFR calc non Af Amer 08/06/2014 58* >59 mL/min/1.73 Final  . GFR calc Af Amer 08/06/2014 66  >59 mL/min/1.73 Final  . BUN/Creatinine Ratio 08/06/2014 25  11 - 26 Final  . Sodium 08/06/2014 146* 134 - 144 mmol/L Final  . Potassium 08/06/2014 4.1  3.5 - 5.2 mmol/L Final  . Chloride 08/06/2014 107  97 - 108 mmol/L Final  . CO2 08/06/2014 23  18 - 29 mmol/L Final  . Calcium 08/06/2014 9.0  8.7 - 10.3 mg/dL Final  . Total Protein 08/06/2014 6.2  6.0 - 8.5 g/dL Final  . Albumin 32/44/0102 3.8  3.2 - 4.6 g/dL Final  . Globulin, Total 08/06/2014 2.4  1.5 - 4.5 g/dL Final  . Albumin/Globulin Ratio 08/06/2014 1.6  1.1 - 2.5 Final  . Total Bilirubin 08/06/2014 0.2  0.0 -  1.2 mg/dL Final  . Alkaline Phosphatase 08/06/2014 116  39 - 117 IU/L Final  . AST 08/06/2014 19  0 - 40 IU/L Final  . ALT 08/06/2014 13  0 - 32 IU/L Final  . WBC 08/06/2014 5.3  3.4 - 10.8 x10E3/uL Final  . RBC 08/06/2014 4.31  3.77 - 5.28 x10E6/uL Final  . Hemoglobin 08/06/2014 12.7  11.1 - 15.9 g/dL Final  . HCT 57/84/6962 39.2  34.0 - 46.6 % Final  . MCV 08/06/2014 91  79 - 97 fL Final  . MCH 08/06/2014 29.5  26.6 - 33.0 pg Final  . MCHC 08/06/2014 32.4  31.5 - 35.7 g/dL Final  . RDW 95/28/4132 14.6  12.3 - 15.4 % Final  . Neutrophils Relative % 08/06/2014 68   Final  . Lymphs 08/06/2014 23   Final  . Monocytes 08/06/2014 6   Final  . Eos 08/06/2014 3   Final  . Basos 08/06/2014 0   Final  . Neutrophils Absolute 08/06/2014 3.7  1.4 - 7.0 x10E3/uL Final  . Lymphocytes Absolute 08/06/2014 1.2  0.7 - 3.1 x10E3/uL Final  . Monocytes Absolute 08/06/2014 0.3  0.1 - 0.9 x10E3/uL Final  .  Eosinophils Absolute 08/06/2014 0.1  0.0 - 0.4 x10E3/uL Final  . Basophils Absolute 08/06/2014 0.0  0.0 - 0.2 x10E3/uL Final  . Immature Granulocytes 08/06/2014 0   Final  . Immature Grans (Abs) 08/06/2014 0.0  0.0 - 0.1 x10E3/uL Final  . Cholesterol, Total 08/06/2014 119  100 - 199 mg/dL Final  . Triglycerides 08/06/2014 54  0 - 149 mg/dL Final  . HDL 44/08/270 38* >39 mg/dL Final   Comment: According to ATP-III Guidelines, HDL-C >59 mg/dL is considered a negative risk factor for CHD.   Marland Kitchen VLDL Cholesterol Cal 08/06/2014 11  5 - 40 mg/dL Final  . LDL Calculated 08/06/2014 70  0 - 99 mg/dL Final  . Chol/HDL Ratio 08/06/2014 3.1  0.0 - 4.4 ratio units Final   Comment:                                   T. Chol/HDL Ratio                                             Men  Women                               1/2 Avg.Risk  3.4    3.3                                   Avg.Risk  5.0    4.4                                2X Avg.Risk  9.6    7.1                                3X Avg.Risk 23.4   11.0      Assessment/Plan    ICD-9-CM ICD-10-CM   1. Insomnia - improving but still uncontrolled  780.52 G47.00   2. Restlessness and agitation- improving but still uncontrolled 799.29 R45.1    307.9    3. Dementia with Lewy bodies 331.82 G31.83     F02.80    --increase remeron to 45 mg qhs.  --increase prozac to 40 mg daily.  --continue klonopin prn  --f/u in 1-2 months. Call if sx's do not improve or worsen   Laine Fonner S. Ancil Linsey  [facility] [address] [place], [place] [address] [phone] Office (Wednesdays and Fridays 8 AM - 5 PM) [phone] Cell (Monday-Friday 8 AM - 5 PM)   Time spent with patient was 25 minutes with >50% of time coordinating care and managing medications.

## 2014-09-11 ENCOUNTER — Encounter: Payer: Self-pay | Admitting: Internal Medicine

## 2014-09-22 ENCOUNTER — Telehealth: Payer: Self-pay | Admitting: *Deleted

## 2014-09-22 NOTE — Telephone Encounter (Signed)
LMOM to return call.

## 2014-09-22 NOTE — Telephone Encounter (Signed)
Ok to increase clonazepam 2mg  #90 take 1 tab po TID prn agitation with 2 RF

## 2014-09-22 NOTE — Telephone Encounter (Signed)
Sandra,daughter called and stated that the medication Mirtazapine 40mg  you prescribed is helping. Patient is now getting about 5-6 hours of sleep but she is having to add Clonazepam 1mg  with it. Daughter wants to know if the Clonazepam be increased. She is having a hard time with the patient especially at "sun down" she is giving her 2 &1/2 tablets of Clonazepam at a time and going over the allotted amount of 5 a day. Please Advise.

## 2014-09-23 MED ORDER — CLONAZEPAM 2 MG PO TABS: ORAL_TABLET | ORAL | Status: DC

## 2014-09-23 NOTE — Telephone Encounter (Signed)
Patient daughter Notified and faxed Rx into pharmacy 

## 2014-10-13 ENCOUNTER — Ambulatory Visit (INDEPENDENT_AMBULATORY_CARE_PROVIDER_SITE_OTHER): Payer: Medicare Other | Admitting: Internal Medicine

## 2014-10-13 ENCOUNTER — Encounter: Payer: Self-pay | Admitting: Internal Medicine

## 2014-10-13 VITALS — BP 130/64 | HR 62 | Temp 98.0°F | Resp 18

## 2014-10-13 DIAGNOSIS — G4700 Insomnia, unspecified: Secondary | ICD-10-CM

## 2014-10-13 DIAGNOSIS — F0280 Dementia in other diseases classified elsewhere without behavioral disturbance: Secondary | ICD-10-CM

## 2014-10-13 DIAGNOSIS — G3183 Dementia with Lewy bodies: Secondary | ICD-10-CM

## 2014-10-13 DIAGNOSIS — R451 Restlessness and agitation: Secondary | ICD-10-CM

## 2014-10-13 MED ORDER — FLUOXETINE HCL (PMDD) 20 MG PO CAPS: 20.0000 mg | ORAL_CAPSULE | Freq: Every morning | ORAL | Status: DC

## 2014-10-13 NOTE — Patient Instructions (Signed)
Continue mirtazepine 45mg  at bedtime and as needed klonopin.  Follow up in 1 month to recheck dementia and sleep

## 2014-10-13 NOTE — Progress Notes (Signed)
Patient ID: Amanda Rivera, female   DOB: [birth year], 81 y.o.   MRN: [id number]    Facility  [facility]    Place of Service:   OFFICE   Allergies  Allergen Reactions  . Bactrim [Sulfamethoxazole-Trimethoprim] Other (See Comments)    Mild dermatitis   . Codeine Other (See Comments)    Gets sick   . Levaquin [Levofloxacin] Other (See Comments)    Mild dermatitis     Chief Complaint  Patient presents with  . Medical Management of Chronic Issues    f/u insomnia, dementia and agitation    HPI:  81 yo female seen today for f/u dementia. Daughter states she had severe sundowning on yesterday which involved  Her biting her hand while she was giving her medicine. She was in a "wild rage". Last night daughter gave her 3 sleeping pills and 3 klonopins. She has fallen x 2 in the last week and injured right frontal head and right knee.   Medications: Patient's Medications  New Prescriptions   FLUOXETINE HCL, PMDD, 20 MG CAPS    Take 1 capsule (20 mg total) by mouth every morning. Take with 40mg  dose fluoxetine  Previous Medications   ACETAMINOPHEN (TYLENOL) 100 MG/ML SOLUTION    1 teaspoon as needed for arthritis, aches, and pain   CLONAZEPAM (KLONOPIN) 2 MG TABLET    Take one tablet by mouth three times daily   FLUOXETINE (PROZAC) 40 MG CAPSULE    Take 1 capsule (40 mg total) by mouth daily.   METOPROLOL SUCCINATE (TOPROL-XL) 50 MG 24 HR TABLET    Take 50 mg by mouth 2 (two) times daily. Take with or immediately following a meal.   MIRTAZAPINE (REMERON) 45 MG TABLET    Take 1 tablet (45 mg total) by mouth at bedtime.   NYSTATIN (NYAMYC) 100000 UNIT/GM POWD    Apply topically daily as needed.   ROSUVASTATIN (CRESTOR) 5 MG TABLET    Take 5 mg by mouth daily.  Modified Medications   No medications on file  Discontinued Medications   No medications on file     Review of Systems  Unable to obtain due to pt's dementia  Filed Vitals:   10/13/14 1430  BP: 130/64  Pulse: 62  Temp: 98 F (36.7  C)  TempSrc: Oral  Resp: 18  SpO2: 95%   There is no height or weight on file to calculate BMI.  Physical Exam  Constitutional: She appears well-developed and well-nourished. She is active.  Non-toxic appearance. She does not appear ill.  Neurological: She is alert.  Psychiatric: Her mood appears not anxious. Her affect is not inappropriate. Her speech is not slurred. She is not agitated and not combative. Cognition and memory are impaired. She does not exhibit a depressed mood. She is communicative.     Labs reviewed: Office Visit on 08/06/2014  Component Date Value Ref Range Status  . Glucose 08/06/2014 111* 65 - 99 mg/dL Final  . BUN 08/29/7251 22  10 - 36 mg/dL Final  . Creatinine, Ser 08/06/2014 0.88  0.57 - 1.00 mg/dL Final  . GFR calc non Af Amer 08/06/2014 58* >59 mL/min/1.73 Final  . GFR calc Af Amer 08/06/2014 66  >59 mL/min/1.73 Final  . BUN/Creatinine Ratio 08/06/2014 25  11 - 26 Final  . Sodium 08/06/2014 146* 134 - 144 mmol/L Final  . Potassium 08/06/2014 4.1  3.5 - 5.2 mmol/L Final  . Chloride 08/06/2014 107  97 - 108 mmol/L Final  . CO2  08/06/2014 23  18 - 29 mmol/L Final  . Calcium 08/06/2014 9.0  8.7 - 10.3 mg/dL Final  . Total Protein 08/06/2014 6.2  6.0 - 8.5 g/dL Final  . Albumin 40/98/1191 3.8  3.2 - 4.6 g/dL Final  . Globulin, Total 08/06/2014 2.4  1.5 - 4.5 g/dL Final  . Albumin/Globulin Ratio 08/06/2014 1.6  1.1 - 2.5 Final  . Total Bilirubin 08/06/2014 0.2  0.0 - 1.2 mg/dL Final  . Alkaline Phosphatase 08/06/2014 116  39 - 117 IU/L Final  . AST 08/06/2014 19  0 - 40 IU/L Final  . ALT 08/06/2014 13  0 - 32 IU/L Final  . WBC 08/06/2014 5.3  3.4 - 10.8 x10E3/uL Final  . RBC 08/06/2014 4.31  3.77 - 5.28 x10E6/uL Final  . Hemoglobin 08/06/2014 12.7  11.1 - 15.9 g/dL Final  . HCT 47/82/9562 39.2  34.0 - 46.6 % Final  . MCV 08/06/2014 91  79 - 97 fL Final  . MCH 08/06/2014 29.5  26.6 - 33.0 pg Final  . MCHC 08/06/2014 32.4  31.5 - 35.7 g/dL Final  . RDW  13/03/6577 14.6  12.3 - 15.4 % Final  . Neutrophils Relative % 08/06/2014 68   Final  . Lymphs 08/06/2014 23   Final  . Monocytes 08/06/2014 6   Final  . Eos 08/06/2014 3   Final  . Basos 08/06/2014 0   Final  . Neutrophils Absolute 08/06/2014 3.7  1.4 - 7.0 x10E3/uL Final  . Lymphocytes Absolute 08/06/2014 1.2  0.7 - 3.1 x10E3/uL Final  . Monocytes Absolute 08/06/2014 0.3  0.1 - 0.9 x10E3/uL Final  . Eosinophils Absolute 08/06/2014 0.1  0.0 - 0.4 x10E3/uL Final  . Basophils Absolute 08/06/2014 0.0  0.0 - 0.2 x10E3/uL Final  . Immature Granulocytes 08/06/2014 0   Final  . Immature Grans (Abs) 08/06/2014 0.0  0.0 - 0.1 x10E3/uL Final  . Cholesterol, Total 08/06/2014 119  100 - 199 mg/dL Final  . Triglycerides 08/06/2014 54  0 - 149 mg/dL Final  . HDL 46/96/2952 38* >39 mg/dL Final   Comment: According to ATP-III Guidelines, HDL-C >59 mg/dL is considered a negative risk factor for CHD.   Marland Kitchen VLDL Cholesterol Cal 08/06/2014 11  5 - 40 mg/dL Final  . LDL Calculated 08/06/2014 70  0 - 99 mg/dL Final  . Chol/HDL Ratio 08/06/2014 3.1  0.0 - 4.4 ratio units Final   Comment:                                   T. Chol/HDL Ratio                                             Men  Women                               1/2 Avg.Risk  3.4    3.3                                   Avg.Risk  5.0    4.4  2X Avg.Risk  9.6    7.1                                3X Avg.Risk 23.4   11.0      Assessment/Plan    ICD-9-CM ICD-10-CM   1. Dementia with Lewy bodies - stable 331.82 G31.83 Fluoxetine HCl, PMDD, 20 MG CAPS    F02.80   2. Restlessness and agitation - min improved 799.29 R45.1 Fluoxetine HCl, PMDD, 20 MG CAPS   307.9    3. Insomnia- improving 780.52 G47.00    --increase fluoxetine to 60mg  daily. She will take 20mg  in AM and 40mg  in PM  --discouraged daughter from giving pt no more than 45mg  qhs to prevent overdosing  --continue klonopin as Rx  --continue other  medications as ordered  --RTO in 1 mo to f/u dementia and insomnia  Brylinn Teaney S. Ancil Linsey  [facility] [address] [place], [place] [address] [phone] Office (Wednesdays and Fridays 8 AM - 5 PM) [phone] Cell (Monday-Friday 8 AM - 5 PM)

## 2014-10-14 ENCOUNTER — Encounter: Payer: Self-pay | Admitting: Internal Medicine

## 2014-11-12 ENCOUNTER — Ambulatory Visit (INDEPENDENT_AMBULATORY_CARE_PROVIDER_SITE_OTHER): Payer: Medicare Other | Admitting: Internal Medicine

## 2014-11-12 ENCOUNTER — Encounter: Payer: Self-pay | Admitting: Internal Medicine

## 2014-11-12 VITALS — BP 122/68 | HR 66 | Temp 97.3°F | Resp 14

## 2014-11-12 DIAGNOSIS — F0280 Dementia in other diseases classified elsewhere without behavioral disturbance: Secondary | ICD-10-CM | POA: Diagnosis not present

## 2014-11-12 DIAGNOSIS — R451 Restlessness and agitation: Secondary | ICD-10-CM

## 2014-11-12 DIAGNOSIS — G4700 Insomnia, unspecified: Secondary | ICD-10-CM | POA: Diagnosis not present

## 2014-11-12 DIAGNOSIS — G3183 Dementia with Lewy bodies: Secondary | ICD-10-CM

## 2014-11-12 MED ORDER — DIVALPROEX SODIUM 125 MG PO CPSP: ORAL_CAPSULE | ORAL | Status: DC

## 2014-11-12 NOTE — Progress Notes (Signed)
Patient ID: Amanda Rivera, female   DOB: [birth year], 81 y.o.   MRN: [id number]    Facility  [facility]    Place of Service:   OFFICE   Allergies  Allergen Reactions  . Bactrim [Sulfamethoxazole-Trimethoprim] Other (See Comments)    Mild dermatitis   . Codeine Other (See Comments)    Gets sick   . Levaquin [Levofloxacin] Other (See Comments)    Mild dermatitis     Chief Complaint  Patient presents with  . Medical Management of Chronic Issues    1 month follow-up on dementia, sleep issues (not sleeping throughout the night)     HPI:  81 yo female seen today for dementia f/u. She fell out of w/c about 1 week ago. She hit head but no LOC. She had diarrhea but it has since resolved. Still not sleeping through the night. Takes 20mg  fluoxetine, crestor and metoprolol in the AM. She is taking fluoxetine 40mg  and klonopin 2mg  in the middle of the day. She still has a lot of agitation during the day and night. She has visual hallucinations.   She is a poor historian due to dementia. Hx obtained from daughter who is her primary caregiver.   Past Medical History  Diagnosis Date  . High blood pressure   . High cholesterol    Past Surgical History  Procedure Laterality Date  . Appendectomy  1944  . Pacemaker insertion  2012    Dr.Klein,Dr.Tilley   . Replacement total knee Right 2004    Dr.Vail   . Total shoulder replacement Right 2011    Dr.Mortenson   . Ankle surgery Right 2014    Dr.Case   . Bladder surgery  2013    Dr.Badlani  . Cataract extraction Bilateral 2012    Dr.Groat   History   Social History  . Marital Status: Widowed    Spouse Name: N/A  . Number of Children: N/A  . Years of Education: N/A   Social History Main Topics  . Smoking status: Never Smoker   . Smokeless tobacco: Not on file  . Alcohol Use: No  . Drug Use: No  . Sexual Activity: Not on file   Other Topics Concern  . None   Social History Narrative   No diet   Do you drink/eat things wit  caffeine- Yes   Widowed, married in [place]   Lives in house, 2 person, no pets           Medications: Patient's Medications  New Prescriptions   No medications on file  Previous Medications   ACETAMINOPHEN (TYLENOL) 100 MG/ML SOLUTION    1 teaspoon as needed for arthritis, aches, and pain   CLONAZEPAM (KLONOPIN) 2 MG TABLET    Take one tablet by mouth three times daily   FLUOXETINE (PROZAC) 40 MG CAPSULE    Take 1 capsule (40 mg total) by mouth daily.   FLUOXETINE HCL, PMDD, 20 MG CAPS    Take 1 capsule (20 mg total) by mouth every morning. Take with 40mg  dose fluoxetine   METOPROLOL SUCCINATE (TOPROL-XL) 50 MG 24 HR TABLET    Take 50 mg by mouth 2 (two) times daily. Take with or immediately following a meal.   MIRTAZAPINE (REMERON) 45 MG TABLET    Take 1 tablet (45 mg total) by mouth at bedtime.   NYSTATIN (NYAMYC) 100000 UNIT/GM POWD    Apply topically daily as needed.   ROSUVASTATIN (CRESTOR) 5 MG TABLET    Take 5 mg by  mouth daily.  Modified Medications   No medications on file  Discontinued Medications   No medications on file     Review of Systems  Unable to perform ROS: Dementia    Filed Vitals:   11/12/14 1054  BP: 122/68  Pulse: 66  Temp: 97.3 F (36.3 C)  TempSrc: Oral  Resp: 14  SpO2: 96%   There is no height or weight on file to calculate BMI.  Physical Exam  Constitutional:  Frail appearing in NAD. Sitting in w/c  HENT:  Head: Normocephalic.  Right frontal hematoma with yellow contusion  Cardiovascular: Normal rate, regular rhythm and intact distal pulses.  Exam reveals no gallop and no friction rub.   Murmur (1/6 SEM) heard. No LE edema. No calf TTP  Pulmonary/Chest: Effort normal and breath sounds normal. No respiratory distress. She has no wheezes. She has no rales.  Neurological: She is alert.  Skin: Skin is warm and dry. No rash noted.  Psychiatric: Her mood appears anxious. She is agitated.     Labs reviewed: No visits with results within 3  Month(s) from this visit. Latest known visit with results is:  Office Visit on 08/06/2014  Component Date Value Ref Range Status  . Glucose 08/06/2014 111* 65 - 99 mg/dL Final  . BUN 30/86/5784 22  10 - 36 mg/dL Final  . Creatinine, Ser 08/06/2014 0.88  0.57 - 1.00 mg/dL Final  . GFR calc non Af Amer 08/06/2014 58* >59 mL/min/1.73 Final  . GFR calc Af Amer 08/06/2014 66  >59 mL/min/1.73 Final  . BUN/Creatinine Ratio 08/06/2014 25  11 - 26 Final  . Sodium 08/06/2014 146* 134 - 144 mmol/L Final  . Potassium 08/06/2014 4.1  3.5 - 5.2 mmol/L Final  . Chloride 08/06/2014 107  97 - 108 mmol/L Final  . CO2 08/06/2014 23  18 - 29 mmol/L Final  . Calcium 08/06/2014 9.0  8.7 - 10.3 mg/dL Final  . Total Protein 08/06/2014 6.2  6.0 - 8.5 g/dL Final  . Albumin 69/62/9528 3.8  3.2 - 4.6 g/dL Final  . Globulin, Total 08/06/2014 2.4  1.5 - 4.5 g/dL Final  . Albumin/Globulin Ratio 08/06/2014 1.6  1.1 - 2.5 Final  . Total Bilirubin 08/06/2014 0.2  0.0 - 1.2 mg/dL Final  . Alkaline Phosphatase 08/06/2014 116  39 - 117 IU/L Final  . AST 08/06/2014 19  0 - 40 IU/L Final  . ALT 08/06/2014 13  0 - 32 IU/L Final  . WBC 08/06/2014 5.3  3.4 - 10.8 x10E3/uL Final  . RBC 08/06/2014 4.31  3.77 - 5.28 x10E6/uL Final  . Hemoglobin 08/06/2014 12.7  11.1 - 15.9 g/dL Final  . HCT 41/32/4401 39.2  34.0 - 46.6 % Final  . MCV 08/06/2014 91  79 - 97 fL Final  . MCH 08/06/2014 29.5  26.6 - 33.0 pg Final  . MCHC 08/06/2014 32.4  31.5 - 35.7 g/dL Final  . RDW 02/72/5366 14.6  12.3 - 15.4 % Final  . Neutrophils Relative % 08/06/2014 68   Final  . Lymphs 08/06/2014 23   Final  . Monocytes 08/06/2014 6   Final  . Eos 08/06/2014 3   Final  . Basos 08/06/2014 0   Final  . Neutrophils Absolute 08/06/2014 3.7  1.4 - 7.0 x10E3/uL Final  . Lymphocytes Absolute 08/06/2014 1.2  0.7 - 3.1 x10E3/uL Final  . Monocytes Absolute 08/06/2014 0.3  0.1 - 0.9 x10E3/uL Final  . Eosinophils Absolute 08/06/2014 0.1  0.0 - 0.4  x10E3/uL Final   . Basophils Absolute 08/06/2014 0.0  0.0 - 0.2 x10E3/uL Final  . Immature Granulocytes 08/06/2014 0   Final  . Immature Grans (Abs) 08/06/2014 0.0  0.0 - 0.1 x10E3/uL Final  . Cholesterol, Total 08/06/2014 119  100 - 199 mg/dL Final  . Triglycerides 08/06/2014 54  0 - 149 mg/dL Final  . HDL 84/13/2440 38* >39 mg/dL Final   Comment: According to ATP-III Guidelines, HDL-C >59 mg/dL is considered a negative risk factor for CHD.   Marland Kitchen VLDL Cholesterol Cal 08/06/2014 11  5 - 40 mg/dL Final  . LDL Calculated 08/06/2014 70  0 - 99 mg/dL Final  . Chol/HDL Ratio 08/06/2014 3.1  0.0 - 4.4 ratio units Final   Comment:                                   T. Chol/HDL Ratio                                             Men  Women                               1/2 Avg.Risk  3.4    3.3                                   Avg.Risk  5.0    4.4                                2X Avg.Risk  9.6    7.1                                3X Avg.Risk 23.4   11.0      Assessment/Plan    ICD-9-CM ICD-10-CM   1. Dementia with Lewy bodies - unchanged but not taking any medication to stabilize cognitive fxn 331.82 G31.83     F02.80   2. Restlessness and agitation - unimproved in clonazepam and fluoxetine 799.29 R45.1    307.9    3. Insomnia - slightly improved on mirtazepine but still has frequent awakenings 780.52 G47.00     --Continue medications as ordered. Avoid extra doses of remeron and clonazepam  --add depakote for agitation and take prn. Will check labs at next visit  --Follow up in 1 month. Will need to discuss end-of-life issues at next [facility] S. Ancil Linsey  [facility] and [facility] [address] [place], [place] [address] [phone] Office (Wednesdays and Fridays 8 AM - 5 PM) [phone] Cell (Monday-Friday 8 AM - 5 PM)

## 2014-11-12 NOTE — Patient Instructions (Signed)
Continue medications as ordered  Use depakote as needed for agitation. Will check labs at next visit  Follow up in 1 month

## 2014-11-30 ENCOUNTER — Telehealth: Payer: Self-pay | Admitting: *Deleted

## 2014-11-30 NOTE — Telephone Encounter (Signed)
Daughter, [name] called and stated that the medication you prescribed Divalproex Sod 125 gave patient diarrhea. She took it for 3 days and starting the 4th day diarrhea started. It started everyday about an hour after she took the medication. It started running out of her so the daughter took patient to ER on Thursday and they admitted her. During her stay that night her O2 dropped to 82, so they sent her home on Oxygen from [facility]. The ER Dr. Catalina Pizzaold her that with such a low dose of Divalproex Sod it [name] not be causing her diarrhea, but it could be a possiblity. The daughter though has not given her anymore. No diarrhea, had good BM today and it was normal. Please Advise.

## 2014-12-01 NOTE — Telephone Encounter (Signed)
Patient daughter Notified and agreed.  

## 2014-12-01 NOTE — Telephone Encounter (Signed)
Noted. Daughter may try giving her 1 cap qhs and see if she has loose stools with that. If so, stop med altogether and f/u as scheduled

## 2014-12-14 ENCOUNTER — Other Ambulatory Visit: Payer: Self-pay | Admitting: *Deleted

## 2014-12-14 MED ORDER — MIRTAZAPINE 45 MG PO TABS: 45.0000 mg | ORAL_TABLET | Freq: Every day | ORAL | Status: DC

## 2014-12-14 MED ORDER — CLONAZEPAM 2 MG PO TABS: ORAL_TABLET | ORAL | Status: DC

## 2014-12-14 NOTE — Telephone Encounter (Signed)
[facility] 

## 2014-12-29 ENCOUNTER — Encounter: Payer: Self-pay | Admitting: Internal Medicine

## 2014-12-29 ENCOUNTER — Ambulatory Visit (INDEPENDENT_AMBULATORY_CARE_PROVIDER_SITE_OTHER): Payer: Medicare Other | Admitting: Internal Medicine

## 2014-12-29 VITALS — BP 132/70 | HR 102 | Temp 97.8°F | Resp 18

## 2014-12-29 DIAGNOSIS — G4700 Insomnia, unspecified: Secondary | ICD-10-CM

## 2014-12-29 DIAGNOSIS — Z79899 Other long term (current) drug therapy: Secondary | ICD-10-CM | POA: Diagnosis not present

## 2014-12-29 DIAGNOSIS — M25531 Pain in right wrist: Secondary | ICD-10-CM | POA: Diagnosis not present

## 2014-12-29 DIAGNOSIS — F482 Pseudobulbar affect: Secondary | ICD-10-CM | POA: Diagnosis not present

## 2014-12-29 DIAGNOSIS — F0280 Dementia in other diseases classified elsewhere without behavioral disturbance: Secondary | ICD-10-CM

## 2014-12-29 DIAGNOSIS — G3183 Dementia with Lewy bodies: Secondary | ICD-10-CM | POA: Diagnosis not present

## 2014-12-29 DIAGNOSIS — R451 Restlessness and agitation: Secondary | ICD-10-CM

## 2014-12-29 MED ORDER — IBUPROFEN 200 MG PO TABS: 200.0000 mg | ORAL_TABLET | Freq: Four times a day (QID) | ORAL | Status: DC | PRN

## 2014-12-29 MED ORDER — DEXTROMETHORPHAN-QUINIDINE 20-10 MG PO CAPS: ORAL_CAPSULE | ORAL | Status: DC

## 2014-12-29 NOTE — Patient Instructions (Addendum)
Take 1 capsule of Nuedexta daily x 7 days then increase to every 12 hours.  May take 1 Advil every 6 hours as needed for joint pain  Continue other medications AS ORDERED.  Follow up in 1 month

## 2014-12-29 NOTE — Progress Notes (Signed)
Patient ID: Amanda COONTS, female   DOB: 1921-01-23, 81 y.o.   MRN: [id number]    [facility]    [facility]   Place of Service:   OFFICE   Chief Complaint  Patient presents with  . Medical Management of Chronic Issues    1 month follow-up on dementia, unable to tolerate divalproex.  Patient with increased agitation, dicsuss increasing clonazepam.  . Medication Management    Discuss Fluoxetine 40 mg, makes patient nauseated if not taken with food   . Arm Problem    Discuss right arm, warm feeling    HPI:  81 yo female presents today for f/u. She has right wrist pain this AM and also right dorsal foot pain. She has been taking Tylenol but no NSAID tried. She has been drinking more and eating better. Pt still agitated. Takes prozac daily as Rx and no nausea when she takes it with food. She is now on nocturnal [place] O2 due to hypoxia (<85%) she experienced while in hospital for diarrhea last month. She stopped the depakote due to diarrhea.  Daughter changed prozac back to night due to daytime nausea.  She is giving her 4 tabs of clonopin daily due to increasing agitation. Pt does not sleep well at night. She cries easily. She wanders. She is a fall risk     Past Medical History  Diagnosis Date  . High blood pressure   . High cholesterol     Past Surgical History  Procedure Laterality Date  . Appendectomy  1944  . Pacemaker insertion  2012    Dr.Klein,Dr.Tilley   . Replacement total knee Right 2004    Dr.Vail   . Total shoulder replacement Right 2011    Dr.Mortenson   . Ankle surgery Right 2014    Dr.Case   . Bladder surgery  2013    Dr.Badlani  . Cataract extraction Bilateral 2012    Dr.Groat    Patient Care Team: Kirt Boys, DO as PCP - General (Internal Medicine)  History   Social History  . Marital Status: Widowed    Spouse Name: N/A  . Number of Children: N/A  . Years of Education: N/A   Occupational History  . Not on file.   Social History Main Topics  .  Smoking status: Never Smoker   . Smokeless tobacco: Not on file  . Alcohol Use: No  . Drug Use: No  . Sexual Activity: Not on file   Other Topics Concern  . Not on file   Social History Narrative   No diet   Do you drink/eat things wit caffeine- Yes   Widowed, married in [place]   Lives in house, 2 person, no pets            reports that she has never smoked. She does not have any smokeless tobacco history on file. She reports that she does not drink alcohol or use illicit drugs.  Allergies  Allergen Reactions  . Bactrim [Sulfamethoxazole-Trimethoprim] Other (See Comments)    Mild dermatitis   . Codeine Other (See Comments)    Gets sick   . Divalproex Sodium     Diarrhea and nausea  . Levaquin [Levofloxacin] Other (See Comments)    Mild dermatitis     Medications: Patient's Medications  New Prescriptions   No medications on file  Previous Medications   ACETAMINOPHEN (TYLENOL) 100 MG/ML SOLUTION    1 teaspoon as needed for arthritis, aches, and pain   CLONAZEPAM (KLONOPIN) 2 MG TABLET  Take one tablet by mouth three times daily   FLUOXETINE (PROZAC) 40 MG CAPSULE    Take 1 capsule (40 mg total) by mouth daily.   FLUOXETINE HCL, PMDD, 20 MG CAPS    Take 1 capsule (20 mg total) by mouth every morning. Take with 40mg  dose fluoxetine   METOPROLOL SUCCINATE (TOPROL-XL) 50 MG 24 HR TABLET    Take 50 mg by mouth 2 (two) times daily. Take with or immediately following a meal.   MIRTAZAPINE (REMERON) 45 MG TABLET    Take 1 tablet (45 mg total) by mouth at bedtime.   NYSTATIN (NYAMYC) 100000 UNIT/GM POWD    Apply topically daily as needed.   OXYGEN    Inhale into the lungs. 2 liters at night   ROSUVASTATIN (CRESTOR) 5 MG TABLET    Take 5 mg by mouth daily.  Modified Medications   No medications on file  Discontinued Medications   DIVALPROEX (DEPAKOTE SPRINKLES) 125 MG CAPSULE    Take 2 caps po prn agitation    Review of Systems  Unable to perform ROS: Dementia    Filed  Vitals:   12/29/14 1557  BP: 132/70  Pulse: 102  Temp: 97.8 F (36.6 C)  TempSrc: Oral  Resp: 18  SpO2: 93%   There is no height or weight on file to calculate BMI.  Physical Exam  Constitutional: She appears well-developed and well-nourished. No distress.  Sitting in w/c  Musculoskeletal: She exhibits edema.       Right wrist: She exhibits decreased range of motion, tenderness, swelling, crepitus and deformity.  Neurological: She is alert.  Skin: No rash noted.  Psychiatric: Her mood appears anxious. She is agitated. She is not combative. Thought content is delusional. Cognition and memory are impaired.     Labs reviewed: No visits with results within 3 Month(s) from this visit. Latest known visit with results is:  Office Visit on 08/06/2014  Component Date Value Ref Range Status  . Glucose 08/06/2014 111* 65 - 99 mg/dL Final  . BUN 46/96/2952 22  10 - 36 mg/dL Final  . Creatinine, Ser 08/06/2014 0.88  0.57 - 1.00 mg/dL Final  . GFR calc non Af Amer 08/06/2014 58* >59 mL/min/1.73 Final  . GFR calc Af Amer 08/06/2014 66  >59 mL/min/1.73 Final  . BUN/Creatinine Ratio 08/06/2014 25  11 - 26 Final  . Sodium 08/06/2014 146* 134 - 144 mmol/L Final  . Potassium 08/06/2014 4.1  3.5 - 5.2 mmol/L Final  . Chloride 08/06/2014 107  97 - 108 mmol/L Final  . CO2 08/06/2014 23  18 - 29 mmol/L Final  . Calcium 08/06/2014 9.0  8.7 - 10.3 mg/dL Final  . Total Protein 08/06/2014 6.2  6.0 - 8.5 g/dL Final  . Albumin 84/13/2440 3.8  3.2 - 4.6 g/dL Final  . Globulin, Total 08/06/2014 2.4  1.5 - 4.5 g/dL Final  . Albumin/Globulin Ratio 08/06/2014 1.6  1.1 - 2.5 Final  . Total Bilirubin 08/06/2014 0.2  0.0 - 1.2 mg/dL Final  . Alkaline Phosphatase 08/06/2014 116  39 - 117 IU/L Final  . AST 08/06/2014 19  0 - 40 IU/L Final  . ALT 08/06/2014 13  0 - 32 IU/L Final  . WBC 08/06/2014 5.3  3.4 - 10.8 x10E3/uL Final  . RBC 08/06/2014 4.31  3.77 - 5.28 x10E6/uL Final  . Hemoglobin 08/06/2014 12.7   11.1 - 15.9 g/dL Final  . HCT 06/23/2535 39.2  34.0 - 46.6 % Final  . MCV  08/06/2014 91  79 - 97 fL Final  . MCH 08/06/2014 29.5  26.6 - 33.0 pg Final  . MCHC 08/06/2014 32.4  31.5 - 35.7 g/dL Final  . RDW 16/05/9603 14.6  12.3 - 15.4 % Final  . Neutrophils Relative % 08/06/2014 68   Final  . Lymphs 08/06/2014 23   Final  . Monocytes 08/06/2014 6   Final  . Eos 08/06/2014 3   Final  . Basos 08/06/2014 0   Final  . Neutrophils Absolute 08/06/2014 3.7  1.4 - 7.0 x10E3/uL Final  . Lymphocytes Absolute 08/06/2014 1.2  0.7 - 3.1 x10E3/uL Final  . Monocytes Absolute 08/06/2014 0.3  0.1 - 0.9 x10E3/uL Final  . Eosinophils Absolute 08/06/2014 0.1  0.0 - 0.4 x10E3/uL Final  . Basophils Absolute 08/06/2014 0.0  0.0 - 0.2 x10E3/uL Final  . Immature Granulocytes 08/06/2014 0   Final  . Immature Grans (Abs) 08/06/2014 0.0  0.0 - 0.1 x10E3/uL Final  . Cholesterol, Total 08/06/2014 119  100 - 199 mg/dL Final  . Triglycerides 08/06/2014 54  0 - 149 mg/dL Final  . HDL 54/04/8118 38* >39 mg/dL Final   Comment: According to ATP-III Guidelines, HDL-C >59 mg/dL is considered a negative risk factor for CHD.   Marland Kitchen VLDL Cholesterol Cal 08/06/2014 11  5 - 40 mg/dL Final  . LDL Calculated 08/06/2014 70  0 - 99 mg/dL Final  . Chol/HDL Ratio 08/06/2014 3.1  0.0 - 4.4 ratio units Final   Comment:                                   T. Chol/HDL Ratio                                             Men  Women                               1/2 Avg.Risk  3.4    3.3                                   Avg.Risk  5.0    4.4                                2X Avg.Risk  9.6    7.1                                3X Avg.Risk 23.4   11.0     No results found.   Assessment/Plan    ICD-9-CM ICD-10-CM   1. Dementia with Lewy bodies 331.82 G31.83 CMP    F02.80   2. Restlessness and agitation - probably due to #6 and #1 799.29 R45.1    307.9    3. Insomnia - probably due to #6 and #1 780.52 G47.00   4. Long-term use of  high-risk medication V58.69 Z79.899 CMP  5. Right wrist pain - probably due to arthritis 719.43 M25.531 ibuprofen (ADVIL,MOTRIN) 200 MG tablet  6. Pseudobulbar affect - new dx 310.81 F48.2 Dextromethorphan-Quinidine (NUEDEXTA) 20-10 MG CAPS   --  Take 1 capsule of Nuedexta daily x 7 days then increase to every 12 hours.  --May take 1 Advil every 6 hours as needed for joint pain  --Continue other medications AS ORDERED.  --Follow up in 1 month  Shadow Stiggers S. Ancil Linsey  [facility] and Adult [facility] [address] [place], [place] [address] [phone] Cell (Monday-Friday 8 AM - 5 PM) [phone] After 5 PM and follow prompts

## 2014-12-30 LAB — COMPREHENSIVE METABOLIC PANEL
ALT: 15 IU/L (ref 0–32)
AST: 16 IU/L (ref 0–40)
Albumin/Globulin Ratio: 1.3 (ref 1.1–2.5)
Albumin: 3.7 g/dL (ref 3.2–4.6)
Alkaline Phosphatase: 128 IU/L — ABNORMAL HIGH (ref 39–117)
BILIRUBIN TOTAL: 0.2 mg/dL (ref 0.0–1.2)
BUN/Creatinine Ratio: 25 (ref 11–26)
BUN: 18 mg/dL (ref 10–36)
CALCIUM: 8.7 mg/dL (ref 8.7–10.3)
CHLORIDE: 102 mmol/L (ref 97–108)
CO2: 25 mmol/L (ref 18–29)
Creatinine, Ser: 0.73 mg/dL (ref 0.57–1.00)
GFR calc Af Amer: 83 mL/min/{1.73_m2} (ref >59)
GFR calc non Af Amer: 72 mL/min/{1.73_m2} (ref >59)
GLUCOSE: 120 mg/dL — AB (ref 65–99)
Globulin, Total: 2.8 g/dL (ref 1.5–4.5)
POTASSIUM: 4.4 mmol/L (ref 3.5–5.2)
Sodium: 140 mmol/L (ref 134–144)
TOTAL PROTEIN: 6.5 g/dL (ref 6.0–8.5)

## 2015-02-02 ENCOUNTER — Ambulatory Visit (INDEPENDENT_AMBULATORY_CARE_PROVIDER_SITE_OTHER): Payer: Medicare Other | Admitting: Internal Medicine

## 2015-02-02 ENCOUNTER — Encounter: Payer: Self-pay | Admitting: Internal Medicine

## 2015-02-02 VITALS — BP 116/78 | HR 66 | Temp 97.9°F | Resp 20

## 2015-02-02 DIAGNOSIS — R451 Restlessness and agitation: Secondary | ICD-10-CM | POA: Diagnosis not present

## 2015-02-02 DIAGNOSIS — Z79899 Other long term (current) drug therapy: Secondary | ICD-10-CM | POA: Diagnosis not present

## 2015-02-02 DIAGNOSIS — G3183 Dementia with Lewy bodies: Secondary | ICD-10-CM | POA: Diagnosis not present

## 2015-02-02 DIAGNOSIS — F482 Pseudobulbar affect: Secondary | ICD-10-CM

## 2015-02-02 DIAGNOSIS — F0280 Dementia in other diseases classified elsewhere without behavioral disturbance: Secondary | ICD-10-CM

## 2015-02-02 DIAGNOSIS — B372 Candidiasis of skin and nail: Secondary | ICD-10-CM

## 2015-02-02 MED ORDER — DEXTROMETHORPHAN-QUINIDINE 20-10 MG PO CAPS: ORAL_CAPSULE | ORAL | Status: DC

## 2015-02-02 NOTE — Patient Instructions (Addendum)
Continue current medications as ordered  Follow up in 2 mos   May use saline spray as needed to keep nose moist  Continue nystatin cream to right underarm rash. May also use cornstarch

## 2015-02-02 NOTE — Progress Notes (Signed)
Patient ID: Amanda Rivera, female   DOB: Dec 28, 1920, 81 y.o.   MRN: [id number]    Location:    [facility]   Place of Service:   OFFICE  Chief Complaint  Patient presents with  . Follow-up    1 month follow-up    HPI:  81 yo female seen today for f/u dementia. nuedexta stated at last OV and is helping. She has been able to reduce clonazepam dose. She is sleeping until 6 AM now. Daughter is giving her 1 remeron, 2 clonazepam at night. No diarrhea. Mood improved. She has constipation and daughter occasionally must disimpact her using finger  BP controlled on metoprolol  She takes crestor for her cholesterol.  Past Medical History  Diagnosis Date  . High blood pressure   . High cholesterol     Past Surgical History  Procedure Laterality Date  . Appendectomy  1944  . Pacemaker insertion  2012    Dr.Klein,Dr.Tilley   . Replacement total knee Right 2004    Dr.Vail   . Total shoulder replacement Right 2011    Dr.Mortenson   . Ankle surgery Right 2014    Dr.Case   . Bladder surgery  2013    Dr.Badlani  . Cataract extraction Bilateral 2012    Dr.Groat    Patient Care Team: Kirt Boys, DO as PCP - General (Internal Medicine)  History   Social History  . Marital Status: Widowed    Spouse Name: N/A  . Number of Children: N/A  . Years of Education: N/A   Occupational History  . Not on file.   Social History Main Topics  . Smoking status: Never Smoker   . Smokeless tobacco: Never Used  . Alcohol Use: No  . Drug Use: No  . Sexual Activity: Not on file   Other Topics Concern  . Not on file   Social History Narrative   No diet   Do you drink/eat things wit caffeine- Yes   Widowed, married in [place]   Lives in house, 2 person, no pets            reports that she has never smoked. She has never used smokeless tobacco. She reports that she does not drink alcohol or use illicit drugs.  Allergies  Allergen Reactions  . Bactrim [Sulfamethoxazole-Trimethoprim] Other  (See Comments)    Mild dermatitis   . Codeine Other (See Comments)    Gets sick   . Divalproex Sodium     Diarrhea and nausea  . Levaquin [Levofloxacin] Other (See Comments)    Mild dermatitis     Medications: Patient's Medications  New Prescriptions   No medications on file  Previous Medications   ACETAMINOPHEN (TYLENOL) 100 MG/ML SOLUTION    1 teaspoon as needed for arthritis, aches, and pain   CLONAZEPAM (KLONOPIN) 2 MG TABLET    Take one tablet by mouth three times daily   DEXTROMETHORPHAN-QUINIDINE (NUEDEXTA) 20-10 MG CAPS    1 cap po daily x 7 days then increase to every 12 hours   FLUOXETINE (PROZAC) 40 MG CAPSULE    Take 1 capsule (40 mg total) by mouth daily.   FLUOXETINE HCL, PMDD, 20 MG CAPS    Take 1 capsule (20 mg total) by mouth every morning. Take with 40mg  dose fluoxetine   IBUPROFEN (ADVIL,MOTRIN) 200 MG TABLET    Take 1 tablet (200 mg total) by mouth every 6 (six) hours as needed.   KETOTIFEN FUMARATE (ALAWAY OP)    Apply to  eye. 1 drop in each eye 2 times daily   METOPROLOL SUCCINATE (TOPROL-XL) 50 MG 24 HR TABLET    Take 50 mg by mouth 2 (two) times daily. Take with or immediately following a meal.   MIRTAZAPINE (REMERON) 45 MG TABLET    Take 1 tablet (45 mg total) by mouth at bedtime.   NYSTATIN (NYAMYC) 100000 UNIT/GM POWD    Apply topically daily as needed.   OXYGEN    Inhale into the lungs. 2 liters at night   ROSUVASTATIN (CRESTOR) 5 MG TABLET    Take 5 mg by mouth daily.   TRIAMCINOLONE (KENALOG) 0.1 % PASTE    Use as directed 1 application in the mouth or throat 2 (two) times daily.  Modified Medications   No medications on file  Discontinued Medications   No medications on file    Review of Systems  Unable to perform ROS: Dementia    Filed Vitals:   02/02/15 1531  BP: 116/78  Pulse: 66  Temp: 97.9 F (36.6 C)  TempSrc: Oral  Resp: 20  SpO2: 91%   There is no height or weight on file to calculate BMI.  Physical Exam  Constitutional: She  appears well-developed and well-nourished.  Frail appearing in NAD. She is sitting in w/c and looks more alert today than I have ever seen her look. Daughter present  Neurological: She is alert.  Skin: Skin is warm and dry. Rash (dry angry appearing red rash on right axillary. no vesicular formation or secondary signs of infection) noted.  Psychiatric: Her mood appears anxious. Her speech is slurred (but more understandable today). She is agitated (but improved). Cognition and memory are impaired.     Labs reviewed: Office Visit on 12/29/2014  Component Date Value Ref Range Status  . Glucose 12/29/2014 120* 65 - 99 mg/dL Final  . BUN 69/62/9528 18  10 - 36 mg/dL Final  . Creatinine, Ser 12/29/2014 0.73  0.57 - 1.00 mg/dL Final  . GFR calc non Af Amer 12/29/2014 72  >59 mL/min/1.73 Final  . GFR calc Af Amer 12/29/2014 83  >59 mL/min/1.73 Final  . BUN/Creatinine Ratio 12/29/2014 25  11 - 26 Final  . Sodium 12/29/2014 140  134 - 144 mmol/L Final  . Potassium 12/29/2014 4.4  3.5 - 5.2 mmol/L Final  . Chloride 12/29/2014 102  97 - 108 mmol/L Final  . CO2 12/29/2014 25  18 - 29 mmol/L Final  . Calcium 12/29/2014 8.7  8.7 - 10.3 mg/dL Final  . Total Protein 12/29/2014 6.5  6.0 - 8.5 g/dL Final  . Albumin 41/32/4401 3.7  3.2 - 4.6 g/dL Final  . Globulin, Total 12/29/2014 2.8  1.5 - 4.5 g/dL Final  . Albumin/Globulin Ratio 12/29/2014 1.3  1.1 - 2.5 Final  . Bilirubin Total 12/29/2014 0.2  0.0 - 1.2 mg/dL Final  . Alkaline Phosphatase 12/29/2014 128* 39 - 117 IU/L Final  . AST 12/29/2014 16  0 - 40 IU/L Final  . ALT 12/29/2014 15  0 - 32 IU/L Final    No results found.   Assessment/Plan   ICD-9-CM ICD-10-CM   1. Pseudobulbar affect - improving 310.81 F48.2 Dextromethorphan-Quinidine (NUEDEXTA) 20-10 MG CAPS  2. Dementia with Lewy bodies - stable 331.82 G31.83     F02.80   3. Restlessness and agitation - improved 799.29 R45.1    307.9    4. Long-term use of high-risk medication V58.69  Z79.899   5. Candidal dermatitis - new 112.3 B37.2     --  Continue current medications as ordered. She will need to increase nuedexta to q12hrs. rx sent to pharmacy  --Follow up in 2 mos   --May use saline spray as needed to keep nose moist  --Continue nystatin cream to right underarm rash. May also use cornstarch  Amanda Wysocki S. Amanda Rivera  [facility] and Adult [facility] [address] [place], [place] [address] [phone] Cell (Monday-Friday 8 AM - 5 PM) [phone] After 5 PM and follow prompts

## 2015-03-01 ENCOUNTER — Other Ambulatory Visit: Payer: Self-pay

## 2015-03-01 DIAGNOSIS — F0280 Dementia in other diseases classified elsewhere without behavioral disturbance: Secondary | ICD-10-CM

## 2015-03-01 DIAGNOSIS — G3183 Dementia with Lewy bodies: Principal | ICD-10-CM

## 2015-03-01 DIAGNOSIS — R451 Restlessness and agitation: Secondary | ICD-10-CM

## 2015-03-01 MED ORDER — FLUOXETINE HCL (PMDD) 20 MG PO CAPS: 20.0000 mg | ORAL_CAPSULE | Freq: Every morning | ORAL | Status: DC

## 2015-03-01 MED ORDER — FLUOXETINE HCL 40 MG PO CAPS: 40.0000 mg | ORAL_CAPSULE | Freq: Every day | ORAL | Status: DC

## 2015-03-10 ENCOUNTER — Other Ambulatory Visit: Payer: Self-pay

## 2015-03-10 MED ORDER — CLONAZEPAM 2 MG PO TABS: ORAL_TABLET | ORAL | Status: DC

## 2015-04-06 ENCOUNTER — Other Ambulatory Visit: Payer: Self-pay

## 2015-04-06 MED ORDER — CLONAZEPAM 2 MG PO TABS: ORAL_TABLET | ORAL | Status: DC

## 2015-04-06 MED ORDER — MIRTAZAPINE 45 MG PO TABS: 45.0000 mg | ORAL_TABLET | Freq: Every day | ORAL | Status: DC

## 2015-04-06 NOTE — Telephone Encounter (Signed)
RX was printed in error for #90/5 refills. RX was discarded and called in for #90/0 refills

## 2015-04-13 ENCOUNTER — Ambulatory Visit: Payer: Medicare Other | Admitting: Internal Medicine

## 2015-04-27 ENCOUNTER — Other Ambulatory Visit: Payer: Self-pay | Admitting: *Deleted

## 2015-04-27 ENCOUNTER — Ambulatory Visit (INDEPENDENT_AMBULATORY_CARE_PROVIDER_SITE_OTHER): Payer: Medicare Other | Admitting: Internal Medicine

## 2015-04-27 ENCOUNTER — Encounter: Payer: Self-pay | Admitting: Internal Medicine

## 2015-04-27 VITALS — BP 120/90 | HR 65 | Temp 97.4°F | Resp 20

## 2015-04-27 DIAGNOSIS — Z79899 Other long term (current) drug therapy: Secondary | ICD-10-CM | POA: Diagnosis not present

## 2015-04-27 DIAGNOSIS — G3183 Dementia with Lewy bodies: Secondary | ICD-10-CM | POA: Diagnosis not present

## 2015-04-27 DIAGNOSIS — R451 Restlessness and agitation: Secondary | ICD-10-CM | POA: Diagnosis not present

## 2015-04-27 DIAGNOSIS — F482 Pseudobulbar affect: Secondary | ICD-10-CM

## 2015-04-27 DIAGNOSIS — G4700 Insomnia, unspecified: Secondary | ICD-10-CM

## 2015-04-27 DIAGNOSIS — F0280 Dementia in other diseases classified elsewhere without behavioral disturbance: Secondary | ICD-10-CM

## 2015-04-27 DIAGNOSIS — L710 Perioral dermatitis: Secondary | ICD-10-CM | POA: Diagnosis not present

## 2015-04-27 MED ORDER — FLUOCINONIDE 0.05 % EX OINT: 1.0000 "application " | TOPICAL_OINTMENT | Freq: Two times a day (BID) | CUTANEOUS | Status: DC | PRN

## 2015-04-27 MED ORDER — RISPERIDONE 0.25 MG PO TABS: 0.2500 mg | ORAL_TABLET | Freq: Two times a day (BID) | ORAL | Status: DC | PRN

## 2015-04-27 NOTE — Progress Notes (Signed)
Patient ID: MCKAYLEE MANDELLA, female   DOB: 07/08/1921, 81 y.o.   MRN: [id number]    Location:    [facility]   Place of Service:  OFFICE   Chief Complaint  Patient presents with  . Medical Management of Chronic Issues    2 month follow-up    HPI:  41 female seen today for f/u. Daughter reports overall she is doing better. She still gets agitated between 12 pm - 4 pm. She takes all meds as ordered. Daughter reports perioral rash that is worse in the AM and improves with fluocinonide 0.05% ointment she got from a family member. Triamcinolone cream ineffective. Not sure if pt is drooling during the night or licking her lips  PBA/dementia/restlessness - improved but still restless. She takes neudexta, prozac 60mg  daily, clonazepam and remeron  Cardiac arrythmia - she is s/p pacer. Takes BB daily  Hyperlipidemia - stable on statin  Daughter reports feeling frustrated a lot and occasionally goes outside to "get away" from pt's yelling whic she states is constant from about 12pm-4pm  Past Medical History  Diagnosis Date  . High blood pressure   . High cholesterol     Past Surgical History  Procedure Laterality Date  . Appendectomy  1944  . Pacemaker insertion  2012    Dr.Klein,Dr.Tilley   . Replacement total knee Right 2004    Dr.Vail   . Total shoulder replacement Right 2011    Dr.Mortenson   . Ankle surgery Right 2014    Dr.Case   . Bladder surgery  2013    Dr.Badlani  . Cataract extraction Bilateral 2012    Dr.Groat    Patient Care Team: Kirt Boys, DO as PCP - General (Internal Medicine)  Social History   Social History  . Marital Status: Widowed    Spouse Name: N/A  . Number of Children: N/A  . Years of Education: N/A   Occupational History  . Not on file.   Social History Main Topics  . Smoking status: Never Smoker   . Smokeless tobacco: Never Used  . Alcohol Use: No  . Drug Use: No  . Sexual Activity: Not on file   Other Topics Concern  . Not on file    Social History Narrative   No diet   Do you drink/eat things wit caffeine- Yes   Widowed, married in [place]   Lives in house, 2 person, no pets            reports that she has never smoked. She has never used smokeless tobacco. She reports that she does not drink alcohol or use illicit drugs.  Allergies  Allergen Reactions  . Bactrim [Sulfamethoxazole-Trimethoprim] Other (See Comments)    Mild dermatitis   . Codeine Other (See Comments)    Gets sick   . Divalproex Sodium     Diarrhea and nausea  . Levaquin [Levofloxacin] Other (See Comments)    Mild dermatitis     Medications: Patient's Medications  New Prescriptions   No medications on file  Previous Medications   ACETAMINOPHEN (TYLENOL) 100 MG/ML SOLUTION    1 teaspoon as needed for arthritis, aches, and pain   CLONAZEPAM (KLONOPIN) 2 MG TABLET    Take one tablet by mouth three times daily   DEXTROMETHORPHAN-QUINIDINE (NUEDEXTA) 20-10 MG CAPS    1 cap po every 12 hours   FLUOXETINE (PROZAC) 40 MG CAPSULE    Take 1 capsule (40 mg total) by mouth daily.   FLUOXETINE HCL, PMDD, 20  MG CAPS    Take 1 capsule (20 mg total) by mouth every morning. Take with 40mg  dose fluoxetine   KETOTIFEN FUMARATE (ALAWAY OP)    Apply to eye. 1 drop in each eye 2 times daily   METOPROLOL SUCCINATE (TOPROL-XL) 50 MG 24 HR TABLET    Take 50 mg by mouth 2 (two) times daily. Take with or immediately following a meal.   MIRTAZAPINE (REMERON) 45 MG TABLET    Take 1 tablet (45 mg total) by mouth at bedtime.   NYSTATIN (NYAMYC) 100000 UNIT/GM POWD    Apply topically daily as needed.   OXYGEN    Inhale into the lungs. 2 liters at night   ROSUVASTATIN (CRESTOR) 5 MG TABLET    Take 5 mg by mouth daily.   TRIAMCINOLONE (KENALOG) 0.1 % PASTE    Use as directed 1 application in the mouth or throat 2 (two) times daily.  Modified Medications   No medications on file  Discontinued Medications   IBUPROFEN (ADVIL,MOTRIN) 200 MG TABLET    Take 1 tablet (200 mg  total) by mouth every 6 (six) hours as needed.    Review of Systems  Unable to perform ROS: Dementia    Filed Vitals:   04/27/15 1125  BP: 120/90  Pulse: 65  Temp: 97.4 F (36.3 C)  TempSrc: Oral  Resp: 20  SpO2: 90%   There is no height or weight on file to calculate BMI.  Physical Exam  Constitutional: She appears well-developed and well-nourished. No distress.  Sitting in w/c. Min agitation. Speech not as garbled today.   HENT:  Mouth/Throat: Oropharynx is clear and moist. No oropharyngeal exudate.  Eyes: Pupils are equal, round, and reactive to light. No scleral icterus.  Neck: Neck supple. Carotid bruit is not present. No tracheal deviation present. No thyromegaly present.  Cardiovascular: Normal rate, regular rhythm, normal heart sounds and intact distal pulses.  Exam reveals no gallop and no friction rub.   No murmur heard. No LE edema b/l. no calf TTP.   Pulmonary/Chest: Breath sounds normal. No stridor. No respiratory distress. She has no wheezes. She has no rhonchi. She has no rales.  Poor inspiratory effort. Palpable ACW pacemaker  Abdominal: Soft. Bowel sounds are normal. She exhibits no distension and no mass. There is no hepatomegaly. There is no tenderness. There is no rebound and no guarding.  Musculoskeletal: She exhibits edema and tenderness.  Lymphadenopathy:    She has no cervical adenopathy.  Neurological: She is alert. She has normal reflexes.  Skin: Skin is warm and dry. Rash (perioral red, scaley dry rash. no vesicles. no stomatitis) noted.  Psychiatric: She has a normal mood and affect. Her behavior is normal.  She is more verbal today     Labs reviewed: No visits with results within 3 Month(s) from this visit. Latest known visit with results is:  Office Visit on 12/29/2014  Component Date Value Ref Range Status  . Glucose 12/29/2014 120* 65 - 99 mg/dL Final  . BUN 16/05/9603 18  10 - 36 mg/dL Final  . Creatinine, Ser 12/29/2014 0.73  0.57 -  1.00 mg/dL Final  . GFR calc non Af Amer 12/29/2014 72  >59 mL/min/1.73 Final  . GFR calc Af Amer 12/29/2014 83  >59 mL/min/1.73 Final  . BUN/Creatinine Ratio 12/29/2014 25  11 - 26 Final  . Sodium 12/29/2014 140  134 - 144 mmol/L Final  . Potassium 12/29/2014 4.4  3.5 - 5.2 mmol/L Final  . Chloride  12/29/2014 102  97 - 108 mmol/L Final  . CO2 12/29/2014 25  18 - 29 mmol/L Final  . Calcium 12/29/2014 8.7  8.7 - 10.3 mg/dL Final  . Total Protein 12/29/2014 6.5  6.0 - 8.5 g/dL Final  . Albumin 47/82/9562 3.7  3.2 - 4.6 g/dL Final  . Globulin, Total 12/29/2014 2.8  1.5 - 4.5 g/dL Final  . Albumin/Globulin Ratio 12/29/2014 1.3  1.1 - 2.5 Final  . Bilirubin Total 12/29/2014 0.2  0.0 - 1.2 mg/dL Final  . Alkaline Phosphatase 12/29/2014 128* 39 - 117 IU/L Final  . AST 12/29/2014 16  0 - 40 IU/L Final  . ALT 12/29/2014 15  0 - 32 IU/L Final    No results found.   Assessment/Plan   ICD-9-CM ICD-10-CM   1. Restlessness and agitation - improved but still uncontrolled 799.29 R45.1 risperiDONE (RISPERDAL) 0.25 MG tablet   307.9    2. Dementia with Lewy bodies - unchanged 331.82 G31.83 risperiDONE (RISPERDAL) 0.25 MG tablet    F02.80   3. Pseudobulbar affect - improved 310.81 F48.2   4. Long-term use of high-risk medication V58.69 Z79.899   5. Insomnia - improved 780.52 G47.00   6. Perioral dermatitis - new 695.3 L71.0     Continue current medications as ordered  Follow up in 2mos for routine visit. Will need labs (CMP, TSH and CBC w diff) at appt  Pt's daughter appears frustrated today. Recommend respite care +/- long term placement in Memory unit facility. Recommend she contacts an attorney that specializes in long term care to discuss options  Arihaan Bellucci S. Ancil Linsey  [facility] [address] [place], [place] [address] [phone] Cell (Monday-Friday 8 AM - 5 PM) [phone] After 5 PM and follow prompts

## 2015-04-27 NOTE — Patient Instructions (Signed)
Continue current medications as ordered  Follow up in 2mos for routine visit. Will need labs at appt  Recommend respite care +/- long term placement in Memory unit facility. Recommend you contact an attorney that specializes in long term care to discuss options

## 2015-05-06 ENCOUNTER — Other Ambulatory Visit: Payer: Self-pay | Admitting: *Deleted

## 2015-05-06 MED ORDER — CLONAZEPAM 2 MG PO TABS: ORAL_TABLET | ORAL | Status: DC

## 2015-05-06 NOTE — Telephone Encounter (Signed)
[facility] 

## 2015-05-30 ENCOUNTER — Other Ambulatory Visit: Payer: Self-pay | Admitting: *Deleted

## 2015-05-30 MED ORDER — CLONAZEPAM 2 MG PO TABS: ORAL_TABLET | ORAL | Status: DC

## 2015-05-30 NOTE — Telephone Encounter (Signed)
[facility] 

## 2015-06-08 ENCOUNTER — Encounter: Payer: Self-pay | Admitting: Internal Medicine

## 2015-06-08 ENCOUNTER — Ambulatory Visit (INDEPENDENT_AMBULATORY_CARE_PROVIDER_SITE_OTHER): Payer: Medicare Other | Admitting: Internal Medicine

## 2015-06-08 VITALS — BP 122/82 | HR 86 | Resp 20

## 2015-06-08 DIAGNOSIS — G3183 Dementia with Lewy bodies: Secondary | ICD-10-CM | POA: Diagnosis not present

## 2015-06-08 DIAGNOSIS — E785 Hyperlipidemia, unspecified: Secondary | ICD-10-CM

## 2015-06-08 DIAGNOSIS — R451 Restlessness and agitation: Secondary | ICD-10-CM | POA: Diagnosis not present

## 2015-06-08 DIAGNOSIS — F482 Pseudobulbar affect: Secondary | ICD-10-CM

## 2015-06-08 DIAGNOSIS — Z950 Presence of cardiac pacemaker: Secondary | ICD-10-CM

## 2015-06-08 DIAGNOSIS — Z23 Encounter for immunization: Secondary | ICD-10-CM

## 2015-06-08 DIAGNOSIS — Z79899 Other long term (current) drug therapy: Secondary | ICD-10-CM

## 2015-06-08 DIAGNOSIS — G4700 Insomnia, unspecified: Secondary | ICD-10-CM

## 2015-06-08 DIAGNOSIS — F0281 Dementia in other diseases classified elsewhere with behavioral disturbance: Secondary | ICD-10-CM

## 2015-06-08 MED ORDER — RISPERIDONE 0.5 MG PO TABS: ORAL_TABLET | ORAL | Status: DC

## 2015-06-08 NOTE — Addendum Note (Signed)
Addended by: Particia LatherLUSTER, Valdemar Mcclenahan C on: 06/08/2015 01:16 PM   Modules accepted: Orders

## 2015-06-08 NOTE — Patient Instructions (Addendum)
Increase risperdal to 0.5 mg twice daily as needed for agitation  Continue other medications as ordered  Flu shot given today  Will call with lab results  Follow up in 2 mos for CPE with ECG

## 2015-06-08 NOTE — Progress Notes (Signed)
Patient ID: Amanda Rivera, female   DOB: [birth year], 81 y.o.   MRN: [id number]    Location:    [facility]   Place of Service:   OFFICE  Chief Complaint  Patient presents with  . Medical Management of Chronic Issues    HPI:  81 yo female seen today for f/u.   Dementia/agitation/PSA - stable on prozac, nuedexta, and prn clonazepam. She has not noticed any change in behavior/agitation with prn risperdal  Insomnia - takes remeron at night and prn clonazepam  HTN - BP stable on metoprolol XL  Hyperlipidemia - takes crestor daily  Hypoxia -  On O2 at night. Uses O2 in daytime prn. No f/c. O2 sats gradually trending down over last several mos from 95% -->88% today. Repeat after taking prozac in exam room was 97%  Arrhythmia - has pacemaker mx by cardio. Takes BB  Past Medical History  Diagnosis Date  . High blood pressure   . High cholesterol     Past Surgical History  Procedure Laterality Date  . Appendectomy  1944  . Pacemaker insertion  2012    Dr.Klein,Dr.Tilley   . Replacement total knee Right 2004    Dr.Vail   . Total shoulder replacement Right 2011    Dr.Mortenson   . Ankle surgery Right 2014    Dr.Case   . Bladder surgery  2013    Dr.Badlani  . Cataract extraction Bilateral 2012    Dr.Groat    Patient Care Team: Kirt Boys, DO as PCP - General (Internal Medicine)  Social History   Social History  . Marital Status: Widowed    Spouse Name: N/A  . Number of Children: N/A  . Years of Education: N/A   Occupational History  . Not on file.   Social History Main Topics  . Smoking status: Never Smoker   . Smokeless tobacco: Never Used  . Alcohol Use: No  . Drug Use: No  . Sexual Activity: Not on file   Other Topics Concern  . Not on file   Social History Narrative   No diet   Do you drink/eat things wit caffeine- Yes   Widowed, married in [place]   Lives in house, 2 person, no pets            reports that she has never smoked. She has never used  smokeless tobacco. She reports that she does not drink alcohol or use illicit drugs.  Allergies  Allergen Reactions  . Bactrim [Sulfamethoxazole-Trimethoprim] Other (See Comments)    Mild dermatitis   . Codeine Other (See Comments)    Gets sick   . Divalproex Sodium     Diarrhea and nausea  . Levaquin [Levofloxacin] Other (See Comments)    Mild dermatitis     Medications: Patient's Medications  New Prescriptions   No medications on file  Previous Medications   ACETAMINOPHEN (TYLENOL) 100 MG/ML SOLUTION    1 teaspoon as needed for arthritis, aches, and pain   CLONAZEPAM (KLONOPIN) 2 MG TABLET    Take one tablet by mouth three times daily   DEXTROMETHORPHAN-QUINIDINE (NUEDEXTA) 20-10 MG CAPS    1 cap po every 12 hours   DOCUSATE SODIUM (COLACE) 100 MG CAPSULE    Take 100 mg by mouth daily.   FLUOCINONIDE OINTMENT (LIDEX) 0.05 %    Apply 1 application topically 2 (two) times daily as needed.   FLUOXETINE (PROZAC) 40 MG CAPSULE    Take 1 capsule (40 mg total) by mouth daily.  FLUOXETINE HCL, PMDD, 20 MG CAPS    Take 1 capsule (20 mg total) by mouth every morning. Take with 40mg  dose fluoxetine   KETOTIFEN FUMARATE (ALAWAY OP)    Apply to eye. 1 drop in each eye 2 times daily   METOPROLOL SUCCINATE (TOPROL-XL) 50 MG 24 HR TABLET    Take 50 mg by mouth 2 (two) times daily. Take with or immediately following a meal.   MIRTAZAPINE (REMERON) 45 MG TABLET    Take 1 tablet (45 mg total) by mouth at bedtime.   NYSTATIN (NYAMYC) 100000 UNIT/GM POWD    Apply topically daily as needed.   OXYGEN    Inhale into the lungs. 2 liters at night   RISPERIDONE (RISPERDAL) 0.25 MG TABLET    Take 1 tablet (0.25 mg total) by mouth 2 (two) times daily as needed (agitation or restlessness).   ROSUVASTATIN (CRESTOR) 5 MG TABLET    Take 5 mg by mouth daily.  Modified Medications   No medications on file  Discontinued Medications   No medications on file    Review of Systems  Unable to perform ROS: Dementia     Filed Vitals:   06/08/15 1119  BP: 122/82  Pulse: 86  Resp: 20  SpO2: 88%   There is no height or weight on file to calculate BMI.  Physical Exam  Constitutional: She appears well-developed and well-nourished. No distress.  Sitting in w/c in NAD. Min agitation  HENT:  Mouth/Throat: Oropharynx is clear and moist. No oropharyngeal exudate.  Eyes: Pupils are equal, round, and reactive to light. No scleral icterus.  Neck: Neck supple. Carotid bruit is not present. No tracheal deviation present. No thyromegaly present.  Cardiovascular: Normal rate, regular rhythm and intact distal pulses.  Exam reveals no gallop and no friction rub.   Murmur (1/6 SEM) heard. No LE edema b/l. no calf TTP.   Pulmonary/Chest: Effort normal and breath sounds normal. No stridor. No respiratory distress. She has no wheezes. She has no rales.  Left ACW pacemaker palpable  Abdominal: Soft. Bowel sounds are normal. She exhibits no distension and no mass. There is no hepatomegaly. There is no tenderness. There is no rebound and no guarding.  Musculoskeletal: She exhibits edema.  Lymphadenopathy:    She has no cervical adenopathy.  Neurological: She is alert.  Skin: Skin is warm and dry. No rash noted.  Psychiatric: Her speech is normal. Her mood appears anxious. She is agitated.     Labs reviewed: No visits with results within 3 Month(s) from this visit. Latest known visit with results is:  Office Visit on 12/29/2014  Component Date Value Ref Range Status  . Glucose 12/29/2014 120* 65 - 99 mg/dL Final  . BUN 23/76/2831 18  10 - 36 mg/dL Final  . Creatinine, Ser 12/29/2014 0.73  0.57 - 1.00 mg/dL Final  . GFR calc non Af Amer 12/29/2014 72  >59 mL/min/1.73 Final  . GFR calc Af Amer 12/29/2014 83  >59 mL/min/1.73 Final  . BUN/Creatinine Ratio 12/29/2014 25  11 - 26 Final  . Sodium 12/29/2014 140  134 - 144 mmol/L Final  . Potassium 12/29/2014 4.4  3.5 - 5.2 mmol/L Final  . Chloride 12/29/2014 102   97 - 108 mmol/L Final  . CO2 12/29/2014 25  18 - 29 mmol/L Final  . Calcium 12/29/2014 8.7  8.7 - 10.3 mg/dL Final  . Total Protein 12/29/2014 6.5  6.0 - 8.5 g/dL Final  . Albumin 51/76/1607 3.7  3.2 -  4.6 g/dL Final  . Globulin, Total 12/29/2014 2.8  1.5 - 4.5 g/dL Final  . Albumin/Globulin Ratio 12/29/2014 1.3  1.1 - 2.5 Final  . Bilirubin Total 12/29/2014 0.2  0.0 - 1.2 mg/dL Final  . Alkaline Phosphatase 12/29/2014 128* 39 - 117 IU/L Final  . AST 12/29/2014 16  0 - 40 IU/L Final  . ALT 12/29/2014 15  0 - 32 IU/L Final    No results found.   Assessment/Plan   ICD-9-CM ICD-10-CM   1. Restlessness and agitation - failing to change as expected 799.29 R45.1    307.9    2. Lewy body dementia with behavioral disturbance - stable 331.82 G31.83    294.11 F02.81   3. Pseudobulbar affect - stable 310.81 F48.2   4. Insomnia - stable 780.52 G47.00   5. Long-term use of high-risk medication V58.69 Z79.899   6. Hyperlipidemia - stable 272.4 E78.5   7. Pacemaker - due to cardiac arrythmia V45.01 Z95.0     --will monitor yeast in groin- if white clump still present, t/c diflucan 150mg   tx  --increase risperdal to 0.5mg  BID prn  --cont other meds as ordered  --f/u with cardio Dr Donnie Aho as scheduled  --check CMP today  --f/u in 2 mos for CPE/ECG Casmir Auguste S. Ancil Linsey  [facility] [address] [place], [place] [address] [phone] Cell (Monday-Friday 8 AM - 5 PM) [phone] After 5 PM and follow prompts

## 2015-06-09 ENCOUNTER — Other Ambulatory Visit: Payer: Self-pay | Admitting: *Deleted

## 2015-06-09 DIAGNOSIS — E860 Dehydration: Secondary | ICD-10-CM

## 2015-06-09 LAB — COMPREHENSIVE METABOLIC PANEL
A/G RATIO: 1.2 (ref 1.1–2.5)
ALBUMIN: 3.6 g/dL (ref 3.2–4.6)
ALT: 24 IU/L (ref 0–32)
AST: 20 IU/L (ref 0–40)
Alkaline Phosphatase: 124 IU/L — ABNORMAL HIGH (ref 39–117)
BUN / CREAT RATIO: 32 — AB (ref 11–26)
BUN: 24 mg/dL (ref 10–36)
CALCIUM: 8.6 mg/dL — AB (ref 8.7–10.3)
CHLORIDE: 108 mmol/L (ref 97–108)
CO2: 24 mmol/L (ref 18–29)
Creatinine, Ser: 0.76 mg/dL (ref 0.57–1.00)
GFR, EST AFRICAN AMERICAN: 79 mL/min/{1.73_m2} (ref >59)
GFR, EST NON AFRICAN AMERICAN: 68 mL/min/{1.73_m2} (ref >59)
Globulin, Total: 3 g/dL (ref 1.5–4.5)
Glucose: 88 mg/dL (ref 65–99)
Potassium: 4.3 mmol/L (ref 3.5–5.2)
SODIUM: 149 mmol/L — AB (ref 134–144)
TOTAL PROTEIN: 6.6 g/dL (ref 6.0–8.5)

## 2015-06-15 ENCOUNTER — Ambulatory Visit: Payer: Medicare Other | Admitting: Internal Medicine

## 2015-06-16 ENCOUNTER — Ambulatory Visit (INDEPENDENT_AMBULATORY_CARE_PROVIDER_SITE_OTHER): Payer: Medicare Other | Admitting: Internal Medicine

## 2015-06-16 ENCOUNTER — Other Ambulatory Visit: Payer: Medicare Other

## 2015-06-16 ENCOUNTER — Encounter: Payer: Self-pay | Admitting: Internal Medicine

## 2015-06-16 VITALS — BP 120/70 | HR 61 | Temp 97.3°F | Resp 20

## 2015-06-16 DIAGNOSIS — L659 Nonscarring hair loss, unspecified: Secondary | ICD-10-CM

## 2015-06-16 DIAGNOSIS — G3183 Dementia with Lewy bodies: Secondary | ICD-10-CM | POA: Diagnosis not present

## 2015-06-16 DIAGNOSIS — B369 Superficial mycosis, unspecified: Secondary | ICD-10-CM

## 2015-06-16 DIAGNOSIS — F0281 Dementia in other diseases classified elsewhere with behavioral disturbance: Secondary | ICD-10-CM | POA: Diagnosis not present

## 2015-06-16 DIAGNOSIS — E860 Dehydration: Secondary | ICD-10-CM

## 2015-06-16 MED ORDER — CLOTRIMAZOLE 1 % EX OINT: 1.0000 "application " | TOPICAL_OINTMENT | Freq: Three times a day (TID) | CUTANEOUS | Status: DC

## 2015-06-16 NOTE — Progress Notes (Signed)
Patient ID: Amanda Rivera, female   DOB: 1920/11/07, 81 y.o.   MRN: [id number]   Location: [facility] Provider: Gwenith Spitz. Renato Gails, D.O., C.M.D.  Goals of Care: Advanced Directive information Does patient have an advance directive?: Yes, Type of Advance Directive: Healthcare Power of Attorney, Does patient want to make changes to advanced directive?: No - Patient declined  Chief Complaint  Patient presents with  . Medical Management of Chronic Issues    rash on vaginal labia, groin area; recent fall hitting head--hematoma present    HPI: Patient is a 81 y.o. female seen in the office today for for an acute visit due to rash primarily, but her daughter also mentions that she had a fall hitting her right thigh and her frontal area.  She has a large ecchymoses on both locations and some mild tenderness.  She had fallen forward in her wheelchair when reaching for a book on a shelf.    Has rash with shininess in her groin, labial folds.   Was on a med before that caused hair loss.  Stopped new med and hair loss stopped.  Her daughter doesn't know which med.  Mom is again losing hair and she's wondering which pill is doing it (?risperdal, ?tsh)  Has tried the lidex ointment under the breasts and also in the groin.  Her daughter keeps her very clean and dry.  Her sister also takes care of her and now has a caregiver.  She also tried to use monkey butt cream, as well.  Review of Systems:  Review of Systems  Constitutional: Negative for fever and chills.  Respiratory: Negative for shortness of breath.   Cardiovascular: Negative for chest pain.  Musculoskeletal: Positive for falls.  Skin: Positive for rash. Negative for itching.  Neurological: Negative for weakness.  Endo/Heme/Allergies: Bruises/bleeds easily.  Psychiatric/Behavioral: Positive for hallucinations and memory loss.       Lewy body dementia     Past Medical History  Diagnosis Date  . High blood pressure   . High  cholesterol     Past Surgical History  Procedure Laterality Date  . Appendectomy  1944  . Pacemaker insertion  2012    Dr.Klein,Dr.Tilley   . Replacement total knee Right 2004    Dr.Vail   . Total shoulder replacement Right 2011    Dr.Mortenson   . Ankle surgery Right 2014    Dr.Case   . Bladder surgery  2013    Dr.Badlani  . Cataract extraction Bilateral 2012    Dr.Groat    Allergies  Allergen Reactions  . Bactrim [Sulfamethoxazole-Trimethoprim] Other (See Comments)    Mild dermatitis   . Codeine Other (See Comments)    Gets sick   . Divalproex Sodium     Diarrhea and nausea  . Levaquin [Levofloxacin] Other (See Comments)    Mild dermatitis       Medication List       This list is accurate as of: 06/16/15 11:30 AM.  Always use your most recent med list.               acetaminophen 100 MG/ML solution  Commonly known as:  TYLENOL  1 teaspoon as needed for arthritis, aches, and pain     ALAWAY OP  Apply to eye. 1 drop in each eye 2 times daily     clonazePAM 2 MG tablet  Commonly known as:  KLONOPIN  Take one tablet by mouth three times daily     Dextromethorphan-Quinidine  20-10 MG Caps  Commonly known as:  NUEDEXTA  1 cap po every 12 hours     docusate sodium 100 MG capsule  Commonly known as:  COLACE  Take 100 mg by mouth daily.     fluocinonide ointment 0.05 %  Commonly known as:  LIDEX  Apply 1 application topically 2 (two) times daily as needed.     FLUoxetine 40 MG capsule  Commonly known as:  PROZAC  Take 1 capsule (40 mg total) by mouth daily.     Fluoxetine HCl (PMDD) 20 MG Caps  Take 1 capsule (20 mg total) by mouth every morning. Take with 40mg  dose fluoxetine     metoprolol succinate 50 MG 24 hr tablet  Commonly known as:  TOPROL-XL  Take 50 mg by mouth 2 (two) times daily. Take with or immediately following a meal.     mirtazapine 45 MG tablet  Commonly known as:  REMERON  Take 1 tablet (45 mg total) by mouth at bedtime.      NYAMYC 100000 UNIT/GM Powd  Apply topically daily as needed.     OXYGEN  Inhale into the lungs. 2 liters at night     risperiDONE 0.5 MG tablet  Commonly known as:  RISPERDAL  Take 1 tab po BID prn agitation or restlessness     rosuvastatin 5 MG tablet  Commonly known as:  CRESTOR  Take 5 mg by mouth daily.        Health Maintenance  Topic Date Due  . ZOSTAVAX  02/23/1983  . PNA vac Low Risk Adult (2 of 2 - PCV13) 08/27/2013  . INFLUENZA VACCINE  03/27/2016  . TETANUS/TDAP  08/27/2022  . DEXA SCAN  Completed    Physical Exam: Filed Vitals:   06/16/15 1112  BP: 120/70  Pulse: 61  Temp: 97.3 F (36.3 C)  TempSrc: Oral  Resp: 20  SpO2: 94%   There is no height or weight on file to calculate BMI. Physical Exam  Constitutional: She appears well-developed and well-nourished. No distress.  Cardiovascular: Normal rate, regular rhythm and normal heart sounds.   Pulmonary/Chest: Effort normal and breath sounds normal. No respiratory distress.  Musculoskeletal:  Seated in wheelchair with legs tied to keep her from falling out at present   Neurological: She is alert.  Jaw tremor; talks about things from past and some of speech does not make sense at all; was very pleasant  Skin:  Large ecchymoses of frontal area size of baseball; erythema with shininess in groin areas and labial folds  Psychiatric: She has a normal mood and affect.  Pleasant and wanting to hold onto my hand and talk     Labs reviewed: Basic Metabolic Panel:  Recent Labs  65/78/46 1234 12/29/14 1639 06/08/15 1229  NA 146* 140 149*  K 4.1 4.4 4.3  CL 107 102 108  CO2 23 25 24   GLUCOSE 111* 120* 88  BUN 22 18 24   CREATININE 0.88 0.73 0.76  CALCIUM 9.0 8.7 8.6*   Liver Function Tests:  Recent Labs  08/06/14 1234 12/29/14 1639 06/08/15 1229  AST 19 16 20   ALT 13 15 24   ALKPHOS 116 128* 124*  BILITOT 0.2 0.2 <0.2  PROT 6.2 6.5 6.6  ALBUMIN 3.8 3.7 3.6   No results for input(s): LIPASE,  AMYLASE in the last 8760 hours. No results for input(s): AMMONIA in the last 8760 hours. CBC:  Recent Labs  08/06/14 1234  WBC 5.3  NEUTROABS 3.7  HGB 12.7  HCT 39.2  MCV 91   Lipid Panel:  Recent Labs  08/06/14 1234  CHOL 119  HDL 38*  LDLCALC 70  TRIG 54  CHOLHDL 3.1   Assessment/Plan 1. Superficial fungal infection of skin - suspect this may be cause of her groin and labial folds - Clotrimazole 1 % OINT; Apply 1 application topically 3 (three) times daily.  Dispense: 56.7 g; Refill: 3  2. Lewy body dementia with behavioral disturbance - continue current therapy -sometimes risperdal could worsen hallucinations and behaviors in this disease and daughter says one of her meds is causing hair loss (could be this)  3. Hair loss -may be due to risperdal, but check tsh with the bmp she had this am - TSH   Labs/tests ordered:   Orders Placed This Encounter  Procedures  . TSH    Next appt:  08/10/2015   Keimari Redland L. Rocio Wolak, D.O. Geriatrics [facility] [address], [place] [address] Cell Phone (Mon-Fri 8am-5pm):  [phone] On Call:  [phone] & follow prompts after 5pm & weekends Office Phone:  [phone] Office Fax:  [phone]

## 2015-06-17 LAB — BASIC METABOLIC PANEL
BUN / CREAT RATIO: 18 (ref 11–26)
BUN: 16 mg/dL (ref 10–36)
CO2: 23 mmol/L (ref 18–29)
CREATININE: 0.91 mg/dL (ref 0.57–1.00)
Calcium: 9.1 mg/dL (ref 8.7–10.3)
Chloride: 105 mmol/L (ref 97–106)
GFR calc non Af Amer: 55 mL/min/{1.73_m2} — ABNORMAL LOW (ref >59)
GFR, EST AFRICAN AMERICAN: 63 mL/min/{1.73_m2} (ref >59)
Glucose: 83 mg/dL (ref 65–99)
Potassium: 4.9 mmol/L (ref 3.5–5.2)
Sodium: 144 mmol/L (ref 136–144)

## 2015-06-17 LAB — TSH: TSH: 2.76 u[IU]/mL (ref 0.450–4.500)

## 2015-06-28 HISTORY — PX: MOUTH SURGERY: SHX715

## 2015-06-30 ENCOUNTER — Other Ambulatory Visit: Payer: Self-pay

## 2015-06-30 DIAGNOSIS — G3183 Dementia with Lewy bodies: Principal | ICD-10-CM

## 2015-06-30 DIAGNOSIS — F0280 Dementia in other diseases classified elsewhere without behavioral disturbance: Secondary | ICD-10-CM

## 2015-06-30 DIAGNOSIS — R451 Restlessness and agitation: Secondary | ICD-10-CM

## 2015-06-30 MED ORDER — FLUOXETINE HCL (PMDD) 20 MG PO CAPS: 20.0000 mg | ORAL_CAPSULE | Freq: Every morning | ORAL | Status: DC

## 2015-07-08 ENCOUNTER — Telehealth: Payer: Self-pay | Admitting: *Deleted

## 2015-07-08 NOTE — Telephone Encounter (Signed)
Diflucan 100mg  #6  take 2 tabs on day 1 then 1 tab po daily x4 and stop. No RF

## 2015-07-08 NOTE — Telephone Encounter (Signed)
Patient daughter, [name] called and stated that patient still has rash in groin area and under breast. She stated that she spoke with her pharmacist and he recommended Diflucan. They have tried all kinds of creams and have changed the brands of diapers and nothing has worked.  Daughter wants to know if they can try the Diflucan. She is clean and dry. Rash is red and shiny, no open places. Please Advise.  [facility] [place]

## 2015-07-11 MED ORDER — FLUCONAZOLE 100 MG PO TABS: ORAL_TABLET | ORAL | Status: DC

## 2015-07-11 NOTE — Telephone Encounter (Signed)
Patient caregiver notified and agreed. Rx faxed to pharmacy.  

## 2015-07-12 ENCOUNTER — Other Ambulatory Visit: Payer: Self-pay | Admitting: *Deleted

## 2015-07-12 MED ORDER — CLONAZEPAM 2 MG PO TABS: ORAL_TABLET | ORAL | Status: DC

## 2015-07-12 NOTE — Telephone Encounter (Signed)
[facility]

## 2015-08-03 ENCOUNTER — Other Ambulatory Visit: Payer: Self-pay | Admitting: *Deleted

## 2015-08-03 MED ORDER — MIRTAZAPINE 45 MG PO TABS: 45.0000 mg | ORAL_TABLET | Freq: Every day | ORAL | Status: DC

## 2015-08-03 NOTE — Telephone Encounter (Signed)
[facility] 

## 2015-08-08 ENCOUNTER — Other Ambulatory Visit: Payer: Self-pay | Admitting: *Deleted

## 2015-08-08 MED ORDER — CLONAZEPAM 2 MG PO TABS: ORAL_TABLET | ORAL | Status: DC

## 2015-08-08 NOTE — Telephone Encounter (Signed)
[facility] 

## 2015-08-10 ENCOUNTER — Ambulatory Visit (INDEPENDENT_AMBULATORY_CARE_PROVIDER_SITE_OTHER): Payer: Medicare Other | Admitting: Internal Medicine

## 2015-08-10 ENCOUNTER — Encounter: Payer: Self-pay | Admitting: Internal Medicine

## 2015-08-10 VITALS — BP 118/70 | HR 64 | Temp 97.4°F | Resp 12

## 2015-08-10 DIAGNOSIS — G3183 Dementia with Lewy bodies: Secondary | ICD-10-CM

## 2015-08-10 DIAGNOSIS — Z0000 Encounter for general adult medical examination without abnormal findings: Secondary | ICD-10-CM | POA: Diagnosis not present

## 2015-08-10 DIAGNOSIS — Z79899 Other long term (current) drug therapy: Secondary | ICD-10-CM | POA: Diagnosis not present

## 2015-08-10 DIAGNOSIS — F0281 Dementia in other diseases classified elsewhere with behavioral disturbance: Secondary | ICD-10-CM

## 2015-08-10 DIAGNOSIS — R451 Restlessness and agitation: Secondary | ICD-10-CM | POA: Diagnosis not present

## 2015-08-10 DIAGNOSIS — Z950 Presence of cardiac pacemaker: Secondary | ICD-10-CM | POA: Diagnosis not present

## 2015-08-10 DIAGNOSIS — E785 Hyperlipidemia, unspecified: Secondary | ICD-10-CM | POA: Diagnosis not present

## 2015-08-10 DIAGNOSIS — F482 Pseudobulbar affect: Secondary | ICD-10-CM | POA: Diagnosis not present

## 2015-08-10 DIAGNOSIS — F02818 Dementia in other diseases classified elsewhere, unspecified severity, with other behavioral disturbance: Secondary | ICD-10-CM

## 2015-08-10 NOTE — Patient Instructions (Addendum)
Encouraged pt to exercise 30-45 minutes 4-5 times per week. Eat a well balanced diet. Avoid smoking. Limit alcohol intake. Wear seatbelt when riding in the car. Wear sun block (SPF >50) when spending extended times outside.  Continue current medications as ordered  Will call with lab results  Follow up in 3 mos for routine visit

## 2015-08-10 NOTE — Progress Notes (Signed)
Patient ID: [name], female   DOB: 1920-09-30, 81 y.o.   MRN: [id number] Subjective:     Amanda Rivera is a 81 y.o. female and is here for a comprehensive physical exam. The patient reports problems - dental concerns. She had several tooth extractions a few weeks ago by local dentist and plans to have partials eventually. F/u appt next month in [place], [place], Dr Madaline Guthrie, for eval of partial upper and lower teeth. She has an appt next month to see eye Dr, Dr Dione Booze. She will see cardiology, Dr Donnie Aho, in early 2017.   Pt is a poor historian due to dementia. Hx obtained from daughter  Past Medical History  Diagnosis Date  . High blood pressure   . High cholesterol    Past Surgical History  Procedure Laterality Date  . Appendectomy  1944  . Pacemaker insertion  2012    Dr.Klein,Dr.Tilley   . Replacement total knee Right 2004    Dr.Vail   . Total shoulder replacement Right 2011    Dr.Mortenson   . Ankle surgery Right 2014    Dr.Case   . Bladder surgery  2013    Dr.Badlani  . Cataract extraction Bilateral 2012    Dr.Groat  . Mouth surgery  06/2015    5 teeth romoved    Family History  Problem Relation Age of Onset  . Heart attack Mother     Social History   Social History  . Marital Status: Widowed    Spouse Name: N/A  . Number of Children: N/A  . Years of Education: N/A   Occupational History  . Not on file.   Social History Main Topics  . Smoking status: Never Smoker   . Smokeless tobacco: Never Used  . Alcohol Use: No  . Drug Use: No  . Sexual Activity: Not on file   Other Topics Concern  . Not on file   Social History Narrative   No diet   Do you drink/eat things wit caffeine- Yes   Widowed, married in [place]   Lives in house, 2 person, no pets          Health Maintenance  Topic Date Due  . PNA vac Low Risk Adult (2 of 2 - PCV13) 08/27/2013  . ZOSTAVAX  08/27/2018 (Originally 02/23/1983)  . INFLUENZA VACCINE  03/27/2016  . TETANUS/TDAP   08/27/2022  . DEXA SCAN  Completed    Review of Systems   Review of Systems  Unable to perform ROS: dementia     Objective:      Physical Exam  Constitutional: She is well-developed, well-nourished, and in no distress. No distress.  HENT:  Head: Normocephalic and atraumatic.  Right Ear: External ear normal.  Left Ear: External ear normal.  Mouth/Throat: Oropharynx is clear and moist. No oropharyngeal exudate.  Eyes: Conjunctivae and EOM are normal. Pupils are equal, round, and reactive to light. No scleral icterus.  Neck: Normal range of motion. Neck supple. Carotid bruit is not present. No tracheal deviation present.  Cardiovascular: Normal rate, regular rhythm, normal heart sounds and intact distal pulses.  Exam reveals no gallop and no friction rub.   No murmur heard. Pulmonary/Chest: Effort normal and breath sounds normal. She has no wheezes. She has no rhonchi. She has no rales. She exhibits no tenderness. Right breast exhibits no inverted nipple, no mass, no nipple discharge, no skin change and no tenderness. Left breast exhibits no inverted nipple, no mass, no nipple discharge, no skin change  and no tenderness. Breasts are symmetrical.  Abdominal: Soft. Bowel sounds are normal. She exhibits no distension and no mass. There is no hepatosplenomegaly. There is no tenderness. There is no rebound and no guarding.  Musculoskeletal: She exhibits edema and tenderness.  Lymphadenopathy:    She has no cervical adenopathy.  Neurological: She is alert.  Skin: Skin is warm and dry. Rash (eczematous rash on right inguinal and right anterior 2nd toe) noted.  Psychiatric: Mood, affect and judgment normal.      Assessment:    Healthy female exam.     ICD-9-CM ICD-10-CM   1. Well adult exam V70.0 Z00.00   2. Lewy body dementia with behavioral disturbance 331.82 G31.83 Hepatic Function Panel   294.11 F02.81   3. Restlessness and agitation 799.29 R45.1 CBC with Differential   307.9     4. Pseudobulbar affect 310.81 F48.2   5. Hyperlipidemia 272.4 E78.5 Lipid Panel  6. Pacemaker V45.01 Z95.0   7. Long-term use of high-risk medication V58.69 Z79.899 Hepatic Function Panel     CBC with Differential    Plan:     See After Visit Summary for Counseling Recommendations   Pt is UTD on health maintenance. Aged out of mammograms and colonoscopy. Vaccinations are UTD. Declined zostavax. Pt is sedentary due to dementia. Encouraged her to exercise as tolerated. Eat a well balanced diet. Avoid smoking. Limit alcohol intake. Wear seatbelt when riding in the car. Wear sun block (SPF >50) when spending extended times outside.  Continue current medications as ordered  Will call with lab results  F/u with specialists as scheduled  Follow up in 3 mos for routine visit   Manali Mcelmurry S. Ancil Linsey  [facility] and [facility] [address] [place], [place] [address] [phone] Cell (Monday-Friday 8 AM - 5 PM) [phone] After 5 PM and follow prompts

## 2015-08-11 LAB — LIPID PANEL
CHOLESTEROL TOTAL: 135 mg/dL (ref 100–199)
Chol/HDL Ratio: 3.2 ratio units (ref 0.0–4.4)
HDL: 42 mg/dL (ref >39)
LDL Calculated: 80 mg/dL (ref 0–99)
Triglycerides: 65 mg/dL (ref 0–149)
VLDL CHOLESTEROL CAL: 13 mg/dL (ref 5–40)

## 2015-08-11 LAB — HEPATIC FUNCTION PANEL
ALT: 11 IU/L (ref 0–32)
AST: 15 IU/L (ref 0–40)
Albumin: 3.4 g/dL (ref 3.2–4.6)
Alkaline Phosphatase: 98 IU/L (ref 39–117)
BILIRUBIN, DIRECT: 0.06 mg/dL (ref 0.00–0.40)
Bilirubin Total: <0.2 mg/dL (ref 0.0–1.2)
Total Protein: 6.1 g/dL (ref 6.0–8.5)

## 2015-08-11 LAB — CBC WITH DIFFERENTIAL/PLATELET
BASOS: 0 %
Basophils Absolute: 0 10*3/uL (ref 0.0–0.2)
EOS (ABSOLUTE): 0.1 10*3/uL (ref 0.0–0.4)
EOS: 3 %
HEMATOCRIT: 36 % (ref 34.0–46.6)
HEMOGLOBIN: 11.6 g/dL (ref 11.1–15.9)
IMMATURE GRANS (ABS): 0 10*3/uL (ref 0.0–0.1)
IMMATURE GRANULOCYTES: 0 %
LYMPHS: 27 %
Lymphocytes Absolute: 1.3 10*3/uL (ref 0.7–3.1)
MCH: 28.8 pg (ref 26.6–33.0)
MCHC: 32.2 g/dL (ref 31.5–35.7)
MCV: 89 fL (ref 79–97)
MONOCYTES: 7 %
MONOS ABS: 0.4 10*3/uL (ref 0.1–0.9)
NEUTROS PCT: 63 %
Neutrophils Absolute: 3 10*3/uL (ref 1.4–7.0)
Platelets: 126 10*3/uL — ABNORMAL LOW (ref 150–379)
RBC: 4.03 x10E6/uL (ref 3.77–5.28)
RDW: 15.2 % (ref 12.3–15.4)
WBC: 4.8 10*3/uL (ref 3.4–10.8)

## 2015-09-08 ENCOUNTER — Other Ambulatory Visit: Payer: Self-pay | Admitting: *Deleted

## 2015-09-08 MED ORDER — CLONAZEPAM 2 MG PO TABS: ORAL_TABLET | ORAL | Status: DC

## 2015-10-03 ENCOUNTER — Other Ambulatory Visit: Payer: Self-pay | Admitting: *Deleted

## 2015-10-03 MED ORDER — FLUOXETINE HCL 40 MG PO CAPS: 40.0000 mg | ORAL_CAPSULE | Freq: Every day | ORAL | Status: DC

## 2015-10-03 NOTE — Telephone Encounter (Signed)
[facility] 

## 2015-10-12 ENCOUNTER — Other Ambulatory Visit: Payer: Self-pay | Admitting: *Deleted

## 2015-10-12 MED ORDER — CLONAZEPAM 2 MG PO TABS: ORAL_TABLET | ORAL | Status: DC

## 2015-10-28 ENCOUNTER — Other Ambulatory Visit: Payer: Self-pay | Admitting: *Deleted

## 2015-10-28 MED ORDER — FLUOXETINE HCL 40 MG PO CAPS: 40.0000 mg | ORAL_CAPSULE | Freq: Every day | ORAL | Status: AC

## 2015-10-28 NOTE — Telephone Encounter (Signed)
[facility] 

## 2015-10-31 ENCOUNTER — Other Ambulatory Visit: Payer: Self-pay | Admitting: *Deleted

## 2015-10-31 DIAGNOSIS — F0280 Dementia in other diseases classified elsewhere without behavioral disturbance: Secondary | ICD-10-CM

## 2015-10-31 DIAGNOSIS — R451 Restlessness and agitation: Secondary | ICD-10-CM

## 2015-10-31 DIAGNOSIS — G3183 Dementia with Lewy bodies: Principal | ICD-10-CM

## 2015-10-31 MED ORDER — FLUOXETINE HCL (PMDD) 20 MG PO CAPS: 20.0000 mg | ORAL_CAPSULE | Freq: Every morning | ORAL | Status: DC

## 2015-10-31 NOTE — Telephone Encounter (Signed)
[facility] 

## 2015-11-09 ENCOUNTER — Encounter: Payer: Self-pay | Admitting: Internal Medicine

## 2015-11-09 ENCOUNTER — Ambulatory Visit (INDEPENDENT_AMBULATORY_CARE_PROVIDER_SITE_OTHER): Payer: Medicare Other | Admitting: Internal Medicine

## 2015-11-09 VITALS — BP 120/76 | HR 63 | Temp 97.1°F

## 2015-11-09 DIAGNOSIS — F0281 Dementia in other diseases classified elsewhere with behavioral disturbance: Secondary | ICD-10-CM

## 2015-11-09 DIAGNOSIS — E785 Hyperlipidemia, unspecified: Secondary | ICD-10-CM | POA: Diagnosis not present

## 2015-11-09 DIAGNOSIS — G4700 Insomnia, unspecified: Secondary | ICD-10-CM | POA: Diagnosis not present

## 2015-11-09 DIAGNOSIS — Z950 Presence of cardiac pacemaker: Secondary | ICD-10-CM | POA: Diagnosis not present

## 2015-11-09 DIAGNOSIS — G3183 Dementia with Lewy bodies: Secondary | ICD-10-CM

## 2015-11-09 DIAGNOSIS — I10 Essential (primary) hypertension: Secondary | ICD-10-CM

## 2015-11-09 DIAGNOSIS — F482 Pseudobulbar affect: Secondary | ICD-10-CM

## 2015-11-09 DIAGNOSIS — F02818 Dementia in other diseases classified elsewhere, unspecified severity, with other behavioral disturbance: Secondary | ICD-10-CM

## 2015-11-09 NOTE — Progress Notes (Signed)
Patient ID: Amanda Rivera, female   DOB: [birth year], 82 y.o.   MRN: [id number]    Location:    P[facility]   Place of Service:   OFFICE  Chief Complaint  Patient presents with  . Medical Management of Chronic Issues    3 month for routine visit    HPI:  82 yo female seen today for f/u. She got an URI and was seen by her previous PCP in [place]. She was tx with abx and steroid shot but then developed vaginal and oral yeast infection. Daughter gave her 3 days of diflucan which helped  Dementia/agitation/PSA - stable on prozac, nuedexta, and prn clonazepam. She has not noticed any change in behavior/agitation with prn risperdal  Insomnia - takes remeron at night and prn clonazepam  HTN - BP stable on metoprolol XL.  Hx SSS/syncope - has pacemaker that was replaced in 2012  Hyperlipidemia - takes crestor daily. LDL 80  Hypoxia -  On O2 at night. Uses O2 in daytime prn. No f/c. O2 sats gradually trending down over last several mos. Today she is 93% on RA  Dental problems - she saw Dr Madaline Guthrie for upper denture fitting. Her lower dentures do not fit well  Past Medical History  Diagnosis Date  . High blood pressure   . High cholesterol   . Dementia with Lewy bodies 08/06/2014  . Hyperlipidemia 08/06/2014  . Insomnia 08/06/2014  . Restlessness and agitation 08/06/2014    Past Surgical History  Procedure Laterality Date  . Appendectomy  1944  . Pacemaker insertion  2012    Dr.Klein,Dr.Tilley   . Replacement total knee Right 2004    Dr.Vail   . Total shoulder replacement Right 2011    Dr.Mortenson   . Ankle surgery Right 2014    Dr.Case   . Bladder surgery  2013    Dr.Badlani  . Cataract extraction Bilateral 2012    Dr.Groat  . Mouth surgery  06/2015    5 teeth romoved     Patient Care Team: Kirt Boys, DO as PCP - General (Internal Medicine)  Social History   Social History  . Marital Status: Widowed    Spouse Name: N/A  . Number of Children: N/A  . Years of  Education: N/A   Occupational History  . Not on file.   Social History Main Topics  . Smoking status: Never Smoker   . Smokeless tobacco: Never Used  . Alcohol Use: No  . Drug Use: No  . Sexual Activity: Not on file   Other Topics Concern  . Not on file   Social History Narrative   No diet   Do you drink/eat things wit caffeine- Yes   Widowed, married in [place]   Lives in house, 2 person, no pets            reports that she has never smoked. She has never used smokeless tobacco. She reports that she does not drink alcohol or use illicit drugs.  Allergies  Allergen Reactions  . Bactrim [Sulfamethoxazole-Trimethoprim] Other (See Comments)    Mild dermatitis   . Codeine Other (See Comments)    Gets sick   . Divalproex Sodium     Diarrhea and nausea  . Levaquin [Levofloxacin] Other (See Comments)    Mild dermatitis     Medications: Patient's Medications  New Prescriptions   No medications on file  Previous Medications   ACETAMINOPHEN (TYLENOL) 100 MG/ML SOLUTION    1 teaspoon as needed for  arthritis, aches, and pain   BACITRACIN-POLYMYXIN B (POLYSPORIN) OINTMENT    Apply topically as needed.   CLONAZEPAM (KLONOPIN) 2 MG TABLET    Take one tablet by mouth three times daily   CLOTRIMAZOLE-BETAMETHASONE (LOTRISONE) CREAM    Apply 1 application topically 2 (two) times daily as needed.   DEXTROMETHORPHAN-QUINIDINE (NUEDEXTA) 20-10 MG CAPS    1 cap po every 12 hours   DIMETHICONE-ZINC OXIDE-VIT A-D (A & D ZINC OXIDE) CREA    Apply topically as needed.   DOCUSATE SODIUM (COLACE) 100 MG CAPSULE    Take 100 mg by mouth daily.   FLUOCINONIDE OINTMENT (LIDEX) 0.05 %    Apply 1 application topically 2 (two) times daily as needed.   FLUOXETINE (PROZAC) 40 MG CAPSULE    Take 1 capsule (40 mg total) by mouth daily.   FLUOXETINE HCL, PMDD, 20 MG CAPS    Take 1 capsule (20 mg total) by mouth every morning. Take with 40mg  dose fluoxetine   KETOTIFEN FUMARATE (ALAWAY OP)    Apply to eye.  1 drop in each eye 2 times daily   METOPROLOL SUCCINATE (TOPROL-XL) 50 MG 24 HR TABLET    Take 50 mg by mouth daily. Dr.Tilley rx's, Take with or immediately following a meal.   MIRTAZAPINE (REMERON) 45 MG TABLET    Take 1 tablet (45 mg total) by mouth at bedtime.   NYSTATIN (NYAMYC) 100000 UNIT/GM POWD    Apply topically daily as needed.   OXYGEN    Inhale into the lungs. 2 liters at night   RISPERIDONE (RISPERDAL) 0.5 MG TABLET    Take 1 tab po BID prn agitation or restlessness   ROSUVASTATIN (CRESTOR) 5 MG TABLET    Take 5 mg by mouth daily.  Modified Medications   No medications on file  Discontinued Medications   No medications on file    Review of Systems  Unable to perform ROS: Dementia    Filed Vitals:   11/09/15 1410  BP: 120/76  Pulse: 63  Temp: 97.1 F (36.2 C)  SpO2: 93%   There is no height or weight on file to calculate BMI.  Physical Exam  Constitutional: She appears well-developed.  Frail appearing in NAD  HENT:  Mouth/Throat: Oropharynx is clear and moist. No oropharyngeal exudate.  Eyes: Pupils are equal, round, and reactive to light. No scleral icterus.  Neck: Neck supple. Carotid bruit is not present. No tracheal deviation present. No thyromegaly present.  Cardiovascular: Normal rate, regular rhythm and intact distal pulses.  Exam reveals no gallop and no friction rub.   Murmur (1/6 SEM) heard. No LE edema b/l. no calf TTP.   Pulmonary/Chest: Effort normal and breath sounds normal. No stridor. No respiratory distress. She has no wheezes. She has no rales.  Abdominal: Soft. Bowel sounds are normal. She exhibits no distension and no mass. There is no hepatomegaly. There is no tenderness. There is no rebound and no guarding.  Musculoskeletal: She exhibits edema.  Lymphadenopathy:    She has no cervical adenopathy.  Neurological: She is alert.  Skin: Skin is warm and dry. No rash noted.  Psychiatric: She has a normal mood and affect. Her behavior is normal.      Labs reviewed: No visits with results within 3 Month(s) from this visit. Latest known visit with results is:  Office Visit on 08/10/2015  Component Date Value Ref Range Status  . Total Protein 08/10/2015 6.1  6.0 - 8.5 g/dL Final  . Albumin 95/62/1308  3.4  3.2 - 4.6 g/dL Final  . Bilirubin Total 08/10/2015 <0.2  0.0 - 1.2 mg/dL Final  . Bilirubin, Direct 08/10/2015 0.06  0.00 - 0.40 mg/dL Final  . Alkaline Phosphatase 08/10/2015 98  39 - 117 IU/L Final  . AST 08/10/2015 15  0 - 40 IU/L Final  . ALT 08/10/2015 11  0 - 32 IU/L Final  . Cholesterol, Total 08/10/2015 135  100 - 199 mg/dL Final  . Triglycerides 08/10/2015 65  0 - 149 mg/dL Final  . HDL 41/32/4401 42  >39 mg/dL Final  . VLDL Cholesterol Cal 08/10/2015 13  5 - 40 mg/dL Final  . LDL Calculated 08/10/2015 80  0 - 99 mg/dL Final  . Chol/HDL Ratio 08/10/2015 3.2  0.0 - 4.4 ratio units Final   Comment:                                   T. Chol/HDL Ratio                                             Men  Women                               1/2 Avg.Risk  3.4    3.3                                   Avg.Risk  5.0    4.4                                2X Avg.Risk  9.6    7.1                                3X Avg.Risk 23.4   11.0   . WBC 08/10/2015 4.8  3.4 - 10.8 x10E3/uL Final  . RBC 08/10/2015 4.03  3.77 - 5.28 x10E6/uL Final  . Hemoglobin 08/10/2015 11.6  11.1 - 15.9 g/dL Final  . Hematocrit 02/72/5366 36.0  34.0 - 46.6 % Final  . MCV 08/10/2015 89  79 - 97 fL Final  . MCH 08/10/2015 28.8  26.6 - 33.0 pg Final  . MCHC 08/10/2015 32.2  31.5 - 35.7 g/dL Final  . RDW 44/10/4740 15.2  12.3 - 15.4 % Final  . Platelets 08/10/2015 126* 150 - 379 x10E3/uL Final  . Neutrophils 08/10/2015 63   Final  . Lymphs 08/10/2015 27   Final  . Monocytes 08/10/2015 7   Final  . Eos 08/10/2015 3   Final  . Basos 08/10/2015 0   Final  . Neutrophils Absolute 08/10/2015 3.0  1.4 - 7.0 x10E3/uL Final  . Lymphocytes Absolute 08/10/2015 1.3   0.7 - 3.1 x10E3/uL Final  . Monocytes Absolute 08/10/2015 0.4  0.1 - 0.9 x10E3/uL Final  . EOS (ABSOLUTE) 08/10/2015 0.1  0.0 - 0.4 x10E3/uL Final  . Basophils Absolute 08/10/2015 0.0  0.0 - 0.2 x10E3/uL Final  . Immature Granulocytes 08/10/2015 0   Final  . Immature Grans (Abs) 08/10/2015 0.0  0.0 - 0.1 x10E3/uL Final  No results found.   Assessment/Plan   ICD-9-CM ICD-10-CM   1. Lewy body dementia with behavioral disturbance 331.82 G31.83    294.11 F02.81   2. Pseudobulbar affect 310.81 F48.2   3. Pacemaker V45.01 Z95.0   4. Insomnia 780.52 G47.00   5. Hyperlipidemia 272.4 E78.5   6.      Benign essential HTN   Continue current medications as ordered  Follow up with specialists as scheduled  Will check labs at next office visit  Follow up in 3 months for routine visit  Candus Braud S. Ancil Linsey  [facility] and [facility] [address] [place], [place] [address] [phone] Cell (Monday-Friday 8 AM - 5 PM) [phone] After 5 PM and follow prompts

## 2015-11-09 NOTE — Patient Instructions (Signed)
Continue current medications as ordered  Follow up with specialists as scheduled  Will check labs at next office visit  Follow up in 3 months for routine visit

## 2015-12-05 ENCOUNTER — Other Ambulatory Visit: Payer: Self-pay

## 2015-12-05 MED ORDER — CLONAZEPAM 2 MG PO TABS: ORAL_TABLET | ORAL | Status: DC

## 2016-01-02 ENCOUNTER — Other Ambulatory Visit: Payer: Self-pay | Admitting: *Deleted

## 2016-01-02 ENCOUNTER — Other Ambulatory Visit: Payer: Self-pay

## 2016-01-02 DIAGNOSIS — F0281 Dementia in other diseases classified elsewhere with behavioral disturbance: Secondary | ICD-10-CM

## 2016-01-02 DIAGNOSIS — G3183 Dementia with Lewy bodies: Secondary | ICD-10-CM

## 2016-01-02 DIAGNOSIS — R451 Restlessness and agitation: Secondary | ICD-10-CM

## 2016-01-02 MED ORDER — MIRTAZAPINE 45 MG PO TABS: 45.0000 mg | ORAL_TABLET | Freq: Every day | ORAL | Status: DC

## 2016-01-02 MED ORDER — RISPERIDONE 0.5 MG PO TABS: ORAL_TABLET | ORAL | Status: AC

## 2016-01-02 MED ORDER — CLONAZEPAM 2 MG PO TABS: ORAL_TABLET | ORAL | Status: DC

## 2016-01-02 NOTE — Telephone Encounter (Signed)
[facility] 

## 2016-01-30 ENCOUNTER — Other Ambulatory Visit: Payer: Self-pay | Admitting: *Deleted

## 2016-01-30 MED ORDER — CLONAZEPAM 2 MG PO TABS: ORAL_TABLET | ORAL | Status: DC

## 2016-01-30 NOTE — Telephone Encounter (Signed)
[facility] 

## 2016-02-10 ENCOUNTER — Ambulatory Visit: Payer: Medicare Other | Admitting: Internal Medicine

## 2016-02-27 ENCOUNTER — Other Ambulatory Visit: Payer: Self-pay

## 2016-02-27 DIAGNOSIS — G3183 Dementia with Lewy bodies: Principal | ICD-10-CM

## 2016-02-27 DIAGNOSIS — F0280 Dementia in other diseases classified elsewhere without behavioral disturbance: Secondary | ICD-10-CM

## 2016-02-27 DIAGNOSIS — R451 Restlessness and agitation: Secondary | ICD-10-CM

## 2016-02-27 MED ORDER — FLUOXETINE HCL (PMDD) 20 MG PO CAPS: 20.0000 mg | ORAL_CAPSULE | Freq: Every morning | ORAL | Status: DC

## 2016-02-29 ENCOUNTER — Encounter: Payer: Self-pay | Admitting: Internal Medicine

## 2016-02-29 ENCOUNTER — Ambulatory Visit (INDEPENDENT_AMBULATORY_CARE_PROVIDER_SITE_OTHER): Payer: Medicare Other | Admitting: Internal Medicine

## 2016-02-29 VITALS — BP 132/78 | HR 60 | Temp 97.9°F

## 2016-02-29 DIAGNOSIS — F482 Pseudobulbar affect: Secondary | ICD-10-CM

## 2016-02-29 DIAGNOSIS — G3183 Dementia with Lewy bodies: Secondary | ICD-10-CM

## 2016-02-29 DIAGNOSIS — Z79899 Other long term (current) drug therapy: Secondary | ICD-10-CM | POA: Diagnosis not present

## 2016-02-29 DIAGNOSIS — F0281 Dementia in other diseases classified elsewhere with behavioral disturbance: Secondary | ICD-10-CM | POA: Diagnosis not present

## 2016-02-29 DIAGNOSIS — E785 Hyperlipidemia, unspecified: Secondary | ICD-10-CM | POA: Diagnosis not present

## 2016-02-29 DIAGNOSIS — G4700 Insomnia, unspecified: Secondary | ICD-10-CM | POA: Diagnosis not present

## 2016-02-29 DIAGNOSIS — F02818 Dementia in other diseases classified elsewhere, unspecified severity, with other behavioral disturbance: Secondary | ICD-10-CM

## 2016-02-29 DIAGNOSIS — R451 Restlessness and agitation: Secondary | ICD-10-CM

## 2016-02-29 DIAGNOSIS — Z950 Presence of cardiac pacemaker: Secondary | ICD-10-CM

## 2016-02-29 DIAGNOSIS — I10 Essential (primary) hypertension: Secondary | ICD-10-CM | POA: Diagnosis not present

## 2016-02-29 MED ORDER — FLUOCINONIDE 0.05 % EX OINT: 1.0000 "application " | TOPICAL_OINTMENT | Freq: Two times a day (BID) | CUTANEOUS | Status: AC | PRN

## 2016-02-29 MED ORDER — DEXTROMETHORPHAN-QUINIDINE 20-10 MG PO CAPS: ORAL_CAPSULE | ORAL | Status: AC

## 2016-02-29 NOTE — Patient Instructions (Addendum)
TAKE NUEDEXTA  1 CAP DAILY X 7 DAYS THEN INCREASE TO 1 CAP EVERY 12 HOURS  Continue other medications as ordered  Will call with lab results  Follow up in 3 mos for routine visit

## 2016-02-29 NOTE — Progress Notes (Signed)
Patient ID: Amanda Rivera, female   DOB: April 26, 1921, 82 y.o.   MRN: [id number]    Location:  PAM Place of Service: OFFICE  Chief Complaint  Patient presents with  . Medical Management of Chronic Issues    3 month follow up    HPI:  82 yo female seen today for f/u. She has increased nasal congestion and seasonal allergy. She is taking nasacort nasal spray and generic allegra which is helping  Dementia/agitation/PSA - takes prozac and prn clonazepam. She has increased agitation/psychosis and has been using prn risperdal more. (+)visual hallucinations. Daughter stopped Nuedexta several mos ago on her own as she states pt had stopped crying.  Insomnia - takes remeron at night and prn clonazepam. Over the last 2 mos, increased difficulty sleeping.  HTN - BP stable on metoprolol XL.  Hx SSS/syncope - has pacemaker that was replaced in 2012  Hyperlipidemia - takes crestor daily. LDL 80  Hypoxia -  On O2 at night. Uses O2 in daytime prn. No f/c. O2 sats gradually trending down over last several mos. Today she is 93% on RA  Dental problems - she saw Dr Madaline Guthrie for upper denture fitting. Her lower dentures do not fit well    Past Medical History  Diagnosis Date  . High blood pressure   . High cholesterol   . Dementia with Lewy bodies 08/06/2014  . Hyperlipidemia 08/06/2014  . Insomnia 08/06/2014  . Restlessness and agitation 08/06/2014    Past Surgical History  Procedure Laterality Date  . Appendectomy  1944  . Pacemaker insertion  2012    Dr.Klein,Dr.Tilley   . Replacement total knee Right 2004    Dr.Vail   . Total shoulder replacement Right 2011    Dr.Mortenson   . Ankle surgery Right 2014    Dr.Case   . Bladder surgery  2013    Dr.Badlani  . Cataract extraction Bilateral 2012    Dr.Groat  . Mouth surgery  06/2015    5 teeth romoved     Patient Care Team: Kirt Boys, DO as PCP - General (Internal Medicine)  Social History   Social History  . Marital Status:  Widowed    Spouse Name: N/A  . Number of Children: N/A  . Years of Education: N/A   Occupational History  . Not on file.   Social History Main Topics  . Smoking status: Never Smoker   . Smokeless tobacco: Never Used  . Alcohol Use: No  . Drug Use: No  . Sexual Activity: Not on file   Other Topics Concern  . Not on file   Social History Narrative   No diet   Do you drink/eat things wit caffeine- Yes   Widowed, married in [place]   Lives in house, 2 person, no pets            reports that she has never smoked. She has never used smokeless tobacco. She reports that she does not drink alcohol or use illicit drugs.  Family History  Problem Relation Age of Onset  . Heart attack Mother    Family Status  Relation Status Death Age  . Father Deceased   . Mother Deceased   . Sister Deceased   . Sister Deceased   . Brother Deceased   . Brother Deceased   . Brother Alive   . Sister Deceased   . Sister Deceased   . Sister Alive   . Daughter Alive   . Daughter Alive   .  Brother Alive   . Daughter Alive      Allergies  Allergen Reactions  . Bactrim [Sulfamethoxazole-Trimethoprim] Other (See Comments)    Mild dermatitis   . Codeine Other (See Comments)    Gets sick   . Divalproex Sodium     Diarrhea and nausea  . Levaquin [Levofloxacin] Other (See Comments) and Dermatitis    Mild dermatitis     Medications: Patient's Medications  New Prescriptions   No medications on file  Previous Medications   ACETAMINOPHEN (TYLENOL) 100 MG/ML SOLUTION    1 teaspoon as needed for arthritis, aches, and pain   BACITRACIN-POLYMYXIN B (POLYSPORIN) OINTMENT    Apply topically as needed.   CLONAZEPAM (KLONOPIN) 2 MG TABLET    Take one tablet by mouth three times daily.   CLOTRIMAZOLE-BETAMETHASONE (LOTRISONE) CREAM    Apply 1 application topically 2 (two) times daily as needed.   DIMETHICONE-ZINC OXIDE-VIT A-D (A & D ZINC OXIDE) CREA    Apply topically as needed.   DOCUSATE SODIUM  (COLACE) 100 MG CAPSULE    Take 100 mg by mouth daily.   FEXOFENADINE (ALLEGRA) 180 MG TABLET    Take 180 mg by mouth daily.   FLUOXETINE (PROZAC) 40 MG CAPSULE    Take 1 capsule (40 mg total) by mouth daily.   FLUOXETINE HCL, PMDD, 20 MG CAPS    Take 1 capsule (20 mg total) by mouth every morning. Take with 40mg  dose fluoxetine   METOPROLOL SUCCINATE (TOPROL-XL) 50 MG 24 HR TABLET    Take 50 mg by mouth daily. Dr.Tilley rx's, Take with or immediately following a meal.   MIRTAZAPINE (REMERON) 45 MG TABLET    Take 1 tablet (45 mg total) by mouth at bedtime.   NYSTATIN (NYAMYC) 100000 UNIT/GM POWD    Apply topically daily as needed.   OXYGEN    Inhale into the lungs. 2 liters at night   RISPERIDONE (RISPERDAL) 0.5 MG TABLET    Take 1 tab po BID prn agitation or restlessness   ROSUVASTATIN (CRESTOR) 5 MG TABLET    Take 5 mg by mouth daily.   TRIAMCINOLONE (NASACORT ALLERGY 24HR) 55 MCG/ACT AERO NASAL INHALER    Place 1 spray into the nose 2 (two) times daily.  Modified Medications   Modified Medication Previous Medication   FLUOCINONIDE OINTMENT (LIDEX) 0.05 % fluocinonide ointment (LIDEX) 0.05 %      Apply 1 application topically 2 (two) times daily as needed.    Apply 1 application topically 2 (two) times daily as needed.  Discontinued Medications   DEXTROMETHORPHAN-QUINIDINE (NUEDEXTA) 20-10 MG CAPS    1 cap po every 12 hours   KETOTIFEN FUMARATE (ALAWAY OP)    Apply to eye. Reported on 02/29/2016    Review of Systems  Unable to perform ROS: Dementia    Filed Vitals:   02/29/16 1407  BP: 132/78  Pulse: 60  Temp: 97.9 F (36.6 C)  TempSrc: Oral   There is no height or weight on file to calculate BMI.  Physical Exam  Constitutional: She appears well-developed.  Frail appearing in NAD, sitting in w/c bending forward  HENT:  Mouth/Throat: No oropharyngeal exudate.  MMM  Eyes: Pupils are equal, round, and reactive to light. No scleral icterus.  Neck: Neck supple. Carotid bruit is  not present. No tracheal deviation present.  Cardiovascular: Normal rate, regular rhythm and intact distal pulses.  Exam reveals no gallop and no friction rub.   Murmur (1/6 SEM) heard. No LE edema  b/l. no calf TTP.   Pulmonary/Chest: Effort normal and breath sounds normal. No stridor. No respiratory distress. She has no wheezes. She has no rales.  Abdominal: Soft. Bowel sounds are normal. She exhibits no distension and no mass. There is no hepatomegaly. There is no tenderness. There is no rebound and no guarding.  Musculoskeletal: She exhibits edema.  Lymphadenopathy:    She has no cervical adenopathy.  Neurological: She is alert.  Skin: Skin is warm and dry. No rash noted.  Left hand contusion due to trauma  Psychiatric: She has a normal mood and affect. Her speech is slurred. She is agitated. Thought content is delusional.  Unintelligible speech     Labs reviewed: No visits with results within 3 Month(s) from this visit. Latest known visit with results is:  Office Visit on 08/10/2015  Component Date Value Ref Range Status  . Total Protein 08/10/2015 6.1  6.0 - 8.5 g/dL Final  . Albumin 21/30/8657 3.4  3.2 - 4.6 g/dL Final  . Bilirubin Total 08/10/2015 <0.2  0.0 - 1.2 mg/dL Final  . Bilirubin, Direct 08/10/2015 0.06  0.00 - 0.40 mg/dL Final  . Alkaline Phosphatase 08/10/2015 98  39 - 117 IU/L Final  . AST 08/10/2015 15  0 - 40 IU/L Final  . ALT 08/10/2015 11  0 - 32 IU/L Final  . Cholesterol, Total 08/10/2015 135  100 - 199 mg/dL Final  . Triglycerides 08/10/2015 65  0 - 149 mg/dL Final  . HDL 84/69/6295 42  >39 mg/dL Final  . VLDL Cholesterol Cal 08/10/2015 13  5 - 40 mg/dL Final  . LDL Calculated 08/10/2015 80  0 - 99 mg/dL Final  . Chol/HDL Ratio 08/10/2015 3.2  0.0 - 4.4 ratio units Final   Comment:                                   T. Chol/HDL Ratio                                             Men  Women                               1/2 Avg.Risk  3.4    3.3                                    Avg.Risk  5.0    4.4                                2X Avg.Risk  9.6    7.1                                3X Avg.Risk 23.4   11.0   . WBC 08/10/2015 4.8  3.4 - 10.8 x10E3/uL Final  . RBC 08/10/2015 4.03  3.77 - 5.28 x10E6/uL Final  . Hemoglobin 08/10/2015 11.6  11.1 - 15.9 g/dL Final  . Hematocrit 28/41/3244 36.0  34.0 - 46.6 % Final  . MCV 08/10/2015 89  79 - 97 fL Final  .  MCH 08/10/2015 28.8  26.6 - 33.0 pg Final  . MCHC 08/10/2015 32.2  31.5 - 35.7 g/dL Final  . RDW 95/28/4132 15.2  12.3 - 15.4 % Final  . Platelets 08/10/2015 126* 150 - 379 x10E3/uL Final  . Neutrophils 08/10/2015 63   Final  . Lymphs 08/10/2015 27   Final  . Monocytes 08/10/2015 7   Final  . Eos 08/10/2015 3   Final  . Basos 08/10/2015 0   Final  . Neutrophils Absolute 08/10/2015 3.0  1.4 - 7.0 x10E3/uL Final  . Lymphocytes Absolute 08/10/2015 1.3  0.7 - 3.1 x10E3/uL Final  . Monocytes Absolute 08/10/2015 0.4  0.1 - 0.9 x10E3/uL Final  . EOS (ABSOLUTE) 08/10/2015 0.1  0.0 - 0.4 x10E3/uL Final  . Basophils Absolute 08/10/2015 0.0  0.0 - 0.2 x10E3/uL Final  . Immature Granulocytes 08/10/2015 0   Final  . Immature Grans (Abs) 08/10/2015 0.0  0.0 - 0.1 x10E3/uL Final    No results found.   Assessment/Plan   ICD-9-CM ICD-10-CM   1. Pseudobulbar affect 310.81 F48.2 Dextromethorphan-Quinidine (NUEDEXTA) 20-10 MG CAPS     CMP  2. Lewy body dementia with behavioral disturbance 331.82 G31.83 CMP   294.11 F02.81   3. Insomnia 780.52 G47.00   4. Restlessness and agitation 799.29 R45.1 CMP   307.9    5. Benign essential HTN 401.1 I10   6. Hyperlipidemia 272.4 E78.5 CMP     Lipid Panel     TSH  7. Pacemaker V45.01 Z95.0   8. Long-term use of high-risk medication V58.69 Z79.899 CMP   TAKE NUEDEXTA  1 CAP DAILY X 7 DAYS THEN INCREASE TO 1 CAP EVERY 12 HOURS  Continue other medications as ordered  Will call with lab results  Follow up in 3 mos for routine visit   Amanda Rivera  [facility] and [facility] [address] [place], [place] [address] [phone] Cell (Monday-Friday 8 AM - 5 PM) [phone] After 5 PM and follow prompts

## 2016-03-01 LAB — COMPREHENSIVE METABOLIC PANEL
ALK PHOS: 105 IU/L (ref 39–117)
ALT: 14 IU/L (ref 0–32)
AST: 13 IU/L (ref 0–40)
Albumin/Globulin Ratio: 1.2 (ref 1.2–2.2)
Albumin: 3.5 g/dL (ref 3.2–4.6)
BUN/Creatinine Ratio: 34 — ABNORMAL HIGH (ref 12–28)
BUN: 24 mg/dL (ref 10–36)
Bilirubin Total: 0.2 mg/dL (ref 0.0–1.2)
CHLORIDE: 108 mmol/L — AB (ref 96–106)
CO2: 24 mmol/L (ref 18–29)
CREATININE: 0.7 mg/dL (ref 0.57–1.00)
Calcium: 8.8 mg/dL (ref 8.7–10.3)
GFR calc Af Amer: 86 mL/min/{1.73_m2} (ref >59)
GFR calc non Af Amer: 75 mL/min/{1.73_m2} (ref >59)
GLOBULIN, TOTAL: 3 g/dL (ref 1.5–4.5)
Glucose: 128 mg/dL — ABNORMAL HIGH (ref 65–99)
Potassium: 4.3 mmol/L (ref 3.5–5.2)
SODIUM: 148 mmol/L — AB (ref 134–144)
Total Protein: 6.5 g/dL (ref 6.0–8.5)

## 2016-03-01 LAB — LIPID PANEL
CHOLESTEROL TOTAL: 185 mg/dL (ref 100–199)
Chol/HDL Ratio: 4.2 ratio units (ref 0.0–4.4)
HDL: 44 mg/dL (ref >39)
LDL CALC: 122 mg/dL — AB (ref 0–99)
TRIGLYCERIDES: 97 mg/dL (ref 0–149)
VLDL Cholesterol Cal: 19 mg/dL (ref 5–40)

## 2016-03-01 LAB — TSH: TSH: 1.29 u[IU]/mL (ref 0.450–4.500)

## 2016-03-06 ENCOUNTER — Other Ambulatory Visit: Payer: Self-pay | Admitting: *Deleted

## 2016-03-06 MED ORDER — CLONAZEPAM 2 MG PO TABS: ORAL_TABLET | ORAL | Status: DC

## 2016-03-06 NOTE — Telephone Encounter (Signed)
[facility]

## 2016-03-06 NOTE — Telephone Encounter (Signed)
[facility] 

## 2016-04-27 ENCOUNTER — Other Ambulatory Visit: Payer: Self-pay | Admitting: *Deleted

## 2016-04-27 MED ORDER — MIRTAZAPINE 45 MG PO TABS: 45.0000 mg | ORAL_TABLET | Freq: Every day | ORAL | 3 refills | Status: DC

## 2016-04-27 NOTE — Telephone Encounter (Signed)
Caregiver called and requested refill on medication. Faxed to pharmacy.

## 2016-05-16 ENCOUNTER — Encounter: Payer: Self-pay | Admitting: Internal Medicine

## 2016-05-24 ENCOUNTER — Other Ambulatory Visit: Payer: Self-pay | Admitting: *Deleted

## 2016-05-24 MED ORDER — CLONAZEPAM 2 MG PO TABS: ORAL_TABLET | ORAL | 0 refills | Status: AC

## 2016-05-24 NOTE — Telephone Encounter (Signed)
[facility] 

## 2016-06-01 ENCOUNTER — Ambulatory Visit: Payer: Medicare Other | Admitting: Internal Medicine

## 2016-06-06 ENCOUNTER — Ambulatory Visit: Payer: Medicare Other | Admitting: Internal Medicine

## 2016-07-04 ENCOUNTER — Ambulatory Visit: Payer: Medicare Other | Admitting: Internal Medicine

## 2016-07-18 ENCOUNTER — Ambulatory Visit (INDEPENDENT_AMBULATORY_CARE_PROVIDER_SITE_OTHER): Payer: Medicare Other | Admitting: Internal Medicine

## 2016-07-18 ENCOUNTER — Encounter: Payer: Self-pay | Admitting: Internal Medicine

## 2016-07-18 VITALS — BP 118/84 | HR 66 | Temp 97.4°F

## 2016-07-18 DIAGNOSIS — Z79899 Other long term (current) drug therapy: Secondary | ICD-10-CM | POA: Diagnosis not present

## 2016-07-18 DIAGNOSIS — I10 Essential (primary) hypertension: Secondary | ICD-10-CM

## 2016-07-18 DIAGNOSIS — G4700 Insomnia, unspecified: Secondary | ICD-10-CM

## 2016-07-18 DIAGNOSIS — F0280 Dementia in other diseases classified elsewhere without behavioral disturbance: Secondary | ICD-10-CM

## 2016-07-18 DIAGNOSIS — R451 Restlessness and agitation: Secondary | ICD-10-CM

## 2016-07-18 DIAGNOSIS — G3183 Dementia with Lewy bodies: Secondary | ICD-10-CM

## 2016-07-18 DIAGNOSIS — F482 Pseudobulbar affect: Secondary | ICD-10-CM

## 2016-07-18 DIAGNOSIS — F0281 Dementia in other diseases classified elsewhere with behavioral disturbance: Secondary | ICD-10-CM

## 2016-07-18 MED ORDER — MIRTAZAPINE 45 MG PO TABS: 45.0000 mg | ORAL_TABLET | Freq: Every day | ORAL | 6 refills | Status: AC

## 2016-07-18 MED ORDER — FLUOXETINE HCL (PMDD) 20 MG PO CAPS: 20.0000 mg | ORAL_CAPSULE | Freq: Every morning | ORAL | 5 refills | Status: AC

## 2016-07-18 NOTE — Patient Instructions (Addendum)
May give extra dose of risperdal if awakens in the middle of the night with agitation/restlessness  Increase Nuedexta to 1 cap every 12hrs  Continue other medications as ordered  Follow up with specialists as scheduled  Follow up in 3 mos for routine visit

## 2016-07-18 NOTE — Progress Notes (Signed)
Patient ID: Amanda Rivera, female   DOB: 1921-01-04, 82 y.o.   MRN: [id number]    Location:  [facility] Place of Service: OFFICE  Chief Complaint  Patient presents with  . Medical Management of Chronic Issues    3 months follow up    HPI:  82 yo female seen today for f/u. She is a poor historian due to dementia. Hx obtained from chart and daughter. She reports pt has increased agitation/restless at night at least 1 time per week. She has given her extra 0.5 tab of remeron and she goes back to sleep. Pt has had increased lip smacking.   She got her flu shot at pharmacy  Seasonal allergy - taking nasacort nasal spray and generic allegra   Dementia/agitation/PSA - takes prozac and prn clonazepam. She has increased agitation/psychosis and has been using prn risperdal more. (+)visual hallucinations. Daughter resumed Nuedexta but only takes 1 daily  Insomnia - takes remeron at night and prn clonazepam. Stable  HTN - BP stable on metoprolol XL.  Hx SSS/syncope - has pacemaker that was replaced in 2012  Hyperlipidemia - takes crestor daily. LDL 80  Hypoxia -  On O2 at night. Uses O2 in daytime prn. No f/c. O2 sats gradually trending down over last several mos. Today she is 93% on RA  Dental problems - she saw Dr Madaline Guthrie for upper denture fitting. Her lower dentures do not fit well    Past Medical History:  Diagnosis Date  . Dementia with Lewy bodies 08/06/2014  . High blood pressure   . High cholesterol   . Hyperlipidemia 08/06/2014  . Insomnia 08/06/2014  . Restlessness and agitation 08/06/2014    Past Surgical History:  Procedure Laterality Date  . ANKLE SURGERY Right 2014   Dr.Case   . APPENDECTOMY  1944  . BLADDER SURGERY  2013   Dr.Badlani  . CATARACT EXTRACTION Bilateral 2012   Dr.Groat  . MOUTH SURGERY  06/2015   5 teeth romoved   . PACEMAKER INSERTION  2012   Dr.Klein,Dr.Tilley   . REPLACEMENT TOTAL KNEE Right 2004   Dr.Vail   . TOTAL SHOULDER REPLACEMENT Right  2011   Dr.Mortenson     Patient Care Team: Kirt Boys, DO as PCP - General (Internal Medicine)  Social History   Social History  . Marital status: Widowed    Spouse name: N/A  . Number of children: N/A  . Years of education: N/A   Occupational History  . Not on file.   Social History Main Topics  . Smoking status: Never Smoker  . Smokeless tobacco: Never Used  . Alcohol use No  . Drug use: No  . Sexual activity: Not on file   Other Topics Concern  . Not on file   Social History Narrative   No diet   Do you drink/eat things wit caffeine- Yes   Widowed, married in [place]   Lives in house, 2 person, no pets            reports that she has never smoked. She has never used smokeless tobacco. She reports that she does not drink alcohol or use drugs.  Family History  Problem Relation Age of Onset  . Heart attack Mother    Family Status  Relation Status  . Father Deceased  . Mother Deceased  . Sister Deceased  . Sister Deceased  . Brother Deceased  . Brother Deceased  . Brother Alive  . Sister Deceased  . Sister Deceased  .  Sister Alive  . Daughter Alive  . Daughter Alive  . Brother Alive  . Daughter Alive     Allergies  Allergen Reactions  . Bactrim [Sulfamethoxazole-Trimethoprim] Other (See Comments)    Mild dermatitis   . Codeine Other (See Comments)    Gets sick   . Divalproex Sodium     Diarrhea and nausea  . Levaquin [Levofloxacin] Other (See Comments) and Dermatitis    Mild dermatitis     Medications: Patient's Medications  New Prescriptions   No medications on file  Previous Medications   ACETAMINOPHEN (TYLENOL) 100 MG/ML SOLUTION    1 teaspoon as needed for arthritis, aches, and pain   BACITRACIN-POLYMYXIN B (POLYSPORIN) OINTMENT    Apply topically as needed.   CLONAZEPAM (KLONOPIN) 2 MG TABLET    Take one tablet by mouth three times daily.   CLOTRIMAZOLE-BETAMETHASONE (LOTRISONE) CREAM    Apply 1 application topically 2 (two) times  daily as needed.   DEXTROMETHORPHAN-QUINIDINE (NUEDEXTA) 20-10 MG CAPS    1 cap po every 12 hours   DIMETHICONE-ZINC OXIDE-VIT A-D (A & D ZINC OXIDE) CREA    Apply topically as needed.   DOCUSATE SODIUM (COLACE) 100 MG CAPSULE    Take 100 mg by mouth daily.   FEXOFENADINE (ALLEGRA) 180 MG TABLET    Take 180 mg by mouth daily.   FLUOCINONIDE OINTMENT (LIDEX) 0.05 %    Apply 1 application topically 2 (two) times daily as needed.   FLUOXETINE (PROZAC) 40 MG CAPSULE    Take 1 capsule (40 mg total) by mouth daily.   FLUOXETINE HCL, PMDD, 20 MG CAPS    Take 1 capsule (20 mg total) by mouth every morning. Take with 40mg  dose fluoxetine   METOPROLOL SUCCINATE (TOPROL-XL) 50 MG 24 HR TABLET    Take 50 mg by mouth daily. Dr.Tilley rx's, Take with or immediately following a meal.   MIRTAZAPINE (REMERON) 45 MG TABLET    Take 1 tablet (45 mg total) by mouth at bedtime.   NYSTATIN (NYAMYC) 100000 UNIT/GM POWD    Apply topically daily as needed.   OXYGEN    Inhale into the lungs. 2 liters at night   OXYMETAZOLINE HCL (NASAL SPRAY NA)    Place 1 spray into the nose 2 (two) times daily.   RISPERIDONE (RISPERDAL) 0.5 MG TABLET    Take 1 tab po BID prn agitation or restlessness   ROSUVASTATIN (CRESTOR) 5 MG TABLET    Take 5 mg by mouth daily.   TRIAMCINOLONE (NASACORT ALLERGY 24HR) 55 MCG/ACT AERO NASAL INHALER    Place 1 spray into the nose 2 (two) times daily.  Modified Medications   No medications on file  Discontinued Medications   No medications on file    Review of Systems  Unable to perform ROS: Dementia    Vitals:   07/18/16 1352  BP: (!) 142/90  Pulse: 66  Temp: 97.4 F (36.3 C)  TempSrc: Oral  SpO2: 96%   There is no height or weight on file to calculate BMI.  Physical Exam  Constitutional: She appears well-developed.  Frail appearing in NAD, sitting in w/c bending forward  HENT:  Mouth/Throat: No oropharyngeal exudate.  MMM  Eyes: Pupils are equal, round, and reactive to light. No  scleral icterus.  Neck: Neck supple. Carotid bruit is not present. No tracheal deviation present.  Cardiovascular: Normal rate, regular rhythm and intact distal pulses.  Exam reveals no gallop and no friction rub.   Murmur (1/6 SEM)  heard. No LE edema b/l. no calf TTP.   Pulmonary/Chest: Effort normal and breath sounds normal. No stridor. No respiratory distress. She has no wheezes. She has no rales.  Abdominal: Soft. Bowel sounds are normal. She exhibits no distension and no mass. There is no hepatomegaly. There is no tenderness. There is no rebound and no guarding.  Musculoskeletal: She exhibits edema.  Lymphadenopathy:    She has no cervical adenopathy.  Neurological: She is alert.  Skin: Skin is warm and dry. No rash noted.  Psychiatric: She has a normal mood and affect. Her speech is slurred. She is agitated. Thought content is delusional.  Unintelligible speech     Labs reviewed: No visits with results within 3 Month(s) from this visit.  Latest known visit with results is:  Office Visit on 02/29/2016  Component Date Value Ref Range Status  . Glucose 03/01/2016 128* 65 - 99 mg/dL Final  . BUN 16/05/9603 24  10 - 36 mg/dL Final  . Creatinine, Ser 03/01/2016 0.70  0.57 - 1.00 mg/dL Final  . GFR calc non Af Amer 03/01/2016 75  >59 mL/min/1.73 Final  . GFR calc Af Amer 03/01/2016 86  >59 mL/min/1.73 Final  . BUN/Creatinine Ratio 03/01/2016 34* 12 - 28 Final  . Sodium 03/01/2016 148* 134 - 144 mmol/L Final  . Potassium 03/01/2016 4.3  3.5 - 5.2 mmol/L Final  . Chloride 03/01/2016 108* 96 - 106 mmol/L Final  . CO2 03/01/2016 24  18 - 29 mmol/L Final  . Calcium 03/01/2016 8.8  8.7 - 10.3 mg/dL Final  . Total Protein 03/01/2016 6.5  6.0 - 8.5 g/dL Final  . Albumin 54/04/8118 3.5  3.2 - 4.6 g/dL Final  . Globulin, Total 03/01/2016 3.0  1.5 - 4.5 g/dL Final  . Albumin/Globulin Ratio 03/01/2016 1.2  1.2 - 2.2 Final  . Bilirubin Total 03/01/2016 0.2  0.0 - 1.2 mg/dL Final  . Alkaline  Phosphatase 03/01/2016 105  39 - 117 IU/L Final  . AST 03/01/2016 13  0 - 40 IU/L Final  . ALT 03/01/2016 14  0 - 32 IU/L Final  . Cholesterol, Total 03/01/2016 185  100 - 199 mg/dL Final  . Triglycerides 03/01/2016 97  0 - 149 mg/dL Final  . HDL 14/78/2956 44  >39 mg/dL Final  . VLDL Cholesterol Cal 03/01/2016 19  5 - 40 mg/dL Final  . LDL Calculated 03/01/2016 213* 0 - 99 mg/dL Final  . Chol/HDL Ratio 03/01/2016 4.2  0.0 - 4.4 ratio units Final   Comment:                                   T. Chol/HDL Ratio                                             Men  Women                               1/2 Avg.Risk  3.4    3.3                                   Avg.Risk  5.0    4.4  2X Avg.Risk  9.6    7.1                                3X Avg.Risk 23.4   11.0   . TSH 03/01/2016 1.290  0.450 - 4.500 uIU/mL Final    No results found.   Assessment/Plan   ICD-9-CM ICD-10-CM   1. Insomnia, unspecified type 780.52 G47.00 mirtazapine (REMERON) 45 MG tablet  2. Lewy body dementia with behavioral disturbance 331.82 G31.83    294.11 F02.81   3. Long-term use of high-risk medication V58.69 Z79.899   4. Benign essential HTN 401.1 I10   5. Pseudobulbar affect 310.81 F48.2   6. Lewy body dementia without behavioral disturbance 331.82 G31.83 Fluoxetine HCl, PMDD, 20 MG CAPS   294.10 F02.80   7. Restlessness and agitation 799.29 R45.1 Fluoxetine HCl, PMDD, 20 MG CAPS   307.9     May give extra dose of risperdal if awakens in the middle of the night with agitation/restlessness  Increase Nuedexta to 1 cap every 12hrs  Continue other medications as ordered  Follow up with specialists as scheduled  Follow up in 3 mos for routine visit   Dariyon Urquilla S. Ancil Linsey  [facility] [address] [place], [place] [address] [phone] Cell (Monday-Friday 8 AM - 5 PM) [phone] After 5 PM and follow prompts

## 2016-08-25 DIAGNOSIS — E7800 Pure hypercholesterolemia, unspecified: Secondary | ICD-10-CM | POA: Diagnosis present

## 2016-08-25 DIAGNOSIS — K611 Rectal abscess: Secondary | ICD-10-CM | POA: Diagnosis not present

## 2016-08-25 DIAGNOSIS — J441 Chronic obstructive pulmonary disease with (acute) exacerbation: Secondary | ICD-10-CM | POA: Diagnosis not present

## 2016-08-25 DIAGNOSIS — K610 Anal abscess: Secondary | ICD-10-CM | POA: Diagnosis present

## 2016-08-25 DIAGNOSIS — M810 Age-related osteoporosis without current pathological fracture: Secondary | ICD-10-CM | POA: Diagnosis present

## 2016-08-25 DIAGNOSIS — G629 Polyneuropathy, unspecified: Secondary | ICD-10-CM | POA: Diagnosis present

## 2016-08-25 DIAGNOSIS — R0902 Hypoxemia: Secondary | ICD-10-CM | POA: Diagnosis present

## 2016-08-25 DIAGNOSIS — Z881 Allergy status to other antibiotic agents status: Secondary | ICD-10-CM | POA: Diagnosis not present

## 2016-08-25 DIAGNOSIS — E860 Dehydration: Secondary | ICD-10-CM | POA: Diagnosis present

## 2016-08-25 DIAGNOSIS — F0280 Dementia in other diseases classified elsewhere without behavioral disturbance: Secondary | ICD-10-CM | POA: Diagnosis present

## 2016-08-25 DIAGNOSIS — L8990 Pressure ulcer of unspecified site, unspecified stage: Secondary | ICD-10-CM | POA: Diagnosis not present

## 2016-08-25 DIAGNOSIS — M150 Primary generalized (osteo)arthritis: Secondary | ICD-10-CM | POA: Diagnosis not present

## 2016-08-25 DIAGNOSIS — I272 Pulmonary hypertension, unspecified: Secondary | ICD-10-CM | POA: Diagnosis present

## 2016-08-25 DIAGNOSIS — J189 Pneumonia, unspecified organism: Secondary | ICD-10-CM | POA: Diagnosis present

## 2016-08-25 DIAGNOSIS — Z96651 Presence of right artificial knee joint: Secondary | ICD-10-CM | POA: Diagnosis present

## 2016-08-25 DIAGNOSIS — Z950 Presence of cardiac pacemaker: Secondary | ICD-10-CM | POA: Diagnosis not present

## 2016-08-25 DIAGNOSIS — R69 Illness, unspecified: Secondary | ICD-10-CM | POA: Diagnosis not present

## 2016-08-25 DIAGNOSIS — R197 Diarrhea, unspecified: Secondary | ICD-10-CM | POA: Diagnosis not present

## 2016-08-25 DIAGNOSIS — G309 Alzheimer's disease, unspecified: Secondary | ICD-10-CM | POA: Diagnosis not present

## 2016-08-25 DIAGNOSIS — M1990 Unspecified osteoarthritis, unspecified site: Secondary | ICD-10-CM | POA: Diagnosis present

## 2016-08-25 DIAGNOSIS — I270 Primary pulmonary hypertension: Secondary | ICD-10-CM | POA: Diagnosis not present

## 2016-08-25 DIAGNOSIS — Z886 Allergy status to analgesic agent status: Secondary | ICD-10-CM | POA: Diagnosis not present

## 2016-08-25 DIAGNOSIS — K649 Unspecified hemorrhoids: Secondary | ICD-10-CM | POA: Diagnosis not present

## 2016-08-25 DIAGNOSIS — Z888 Allergy status to other drugs, medicaments and biological substances status: Secondary | ICD-10-CM | POA: Diagnosis not present

## 2016-08-25 DIAGNOSIS — I10 Essential (primary) hypertension: Secondary | ICD-10-CM | POA: Diagnosis not present

## 2016-08-25 DIAGNOSIS — Z79899 Other long term (current) drug therapy: Secondary | ICD-10-CM | POA: Diagnosis not present

## 2016-08-25 DIAGNOSIS — Z882 Allergy status to sulfonamides status: Secondary | ICD-10-CM | POA: Diagnosis not present

## 2016-08-29 DIAGNOSIS — M150 Primary generalized (osteo)arthritis: Secondary | ICD-10-CM | POA: Diagnosis not present

## 2016-08-29 DIAGNOSIS — J189 Pneumonia, unspecified organism: Secondary | ICD-10-CM | POA: Diagnosis not present

## 2016-08-29 DIAGNOSIS — I270 Primary pulmonary hypertension: Secondary | ICD-10-CM | POA: Diagnosis not present

## 2016-08-29 DIAGNOSIS — L8990 Pressure ulcer of unspecified site, unspecified stage: Secondary | ICD-10-CM | POA: Diagnosis not present

## 2016-08-29 DIAGNOSIS — J441 Chronic obstructive pulmonary disease with (acute) exacerbation: Secondary | ICD-10-CM | POA: Diagnosis not present

## 2016-08-29 DIAGNOSIS — G309 Alzheimer's disease, unspecified: Secondary | ICD-10-CM | POA: Diagnosis not present

## 2016-08-31 DIAGNOSIS — L089 Local infection of the skin and subcutaneous tissue, unspecified: Secondary | ICD-10-CM | POA: Diagnosis not present

## 2016-08-31 DIAGNOSIS — N390 Urinary tract infection, site not specified: Secondary | ICD-10-CM | POA: Diagnosis not present

## 2016-08-31 DIAGNOSIS — J189 Pneumonia, unspecified organism: Secondary | ICD-10-CM | POA: Diagnosis not present

## 2016-08-31 DIAGNOSIS — I10 Essential (primary) hypertension: Secondary | ICD-10-CM | POA: Diagnosis not present

## 2016-08-31 DIAGNOSIS — R69 Illness, unspecified: Secondary | ICD-10-CM | POA: Diagnosis not present

## 2016-08-31 DIAGNOSIS — G309 Alzheimer's disease, unspecified: Secondary | ICD-10-CM | POA: Diagnosis not present

## 2016-09-04 DIAGNOSIS — G309 Alzheimer's disease, unspecified: Secondary | ICD-10-CM | POA: Diagnosis not present

## 2016-09-04 DIAGNOSIS — Z299 Encounter for prophylactic measures, unspecified: Secondary | ICD-10-CM | POA: Diagnosis not present

## 2016-09-04 DIAGNOSIS — I10 Essential (primary) hypertension: Secondary | ICD-10-CM | POA: Diagnosis not present

## 2016-09-04 DIAGNOSIS — Z09 Encounter for follow-up examination after completed treatment for conditions other than malignant neoplasm: Secondary | ICD-10-CM | POA: Diagnosis not present

## 2016-09-04 DIAGNOSIS — R69 Illness, unspecified: Secondary | ICD-10-CM | POA: Diagnosis not present

## 2016-09-06 DIAGNOSIS — L089 Local infection of the skin and subcutaneous tissue, unspecified: Secondary | ICD-10-CM | POA: Diagnosis not present

## 2016-09-06 DIAGNOSIS — J189 Pneumonia, unspecified organism: Secondary | ICD-10-CM | POA: Diagnosis not present

## 2016-09-06 DIAGNOSIS — N390 Urinary tract infection, site not specified: Secondary | ICD-10-CM | POA: Diagnosis not present

## 2016-09-06 DIAGNOSIS — I10 Essential (primary) hypertension: Secondary | ICD-10-CM | POA: Diagnosis not present

## 2016-09-06 DIAGNOSIS — R69 Illness, unspecified: Secondary | ICD-10-CM | POA: Diagnosis not present

## 2016-09-06 DIAGNOSIS — G309 Alzheimer's disease, unspecified: Secondary | ICD-10-CM | POA: Diagnosis not present

## 2016-09-11 DIAGNOSIS — G2581 Restless legs syndrome: Secondary | ICD-10-CM | POA: Diagnosis not present

## 2016-09-11 DIAGNOSIS — R531 Weakness: Secondary | ICD-10-CM | POA: Diagnosis not present

## 2016-09-11 DIAGNOSIS — G309 Alzheimer's disease, unspecified: Secondary | ICD-10-CM | POA: Diagnosis not present

## 2016-09-11 DIAGNOSIS — R0602 Shortness of breath: Secondary | ICD-10-CM | POA: Diagnosis not present

## 2016-09-11 DIAGNOSIS — M109 Gout, unspecified: Secondary | ICD-10-CM | POA: Diagnosis not present

## 2016-09-11 DIAGNOSIS — E785 Hyperlipidemia, unspecified: Secondary | ICD-10-CM | POA: Diagnosis not present

## 2016-09-11 DIAGNOSIS — R1310 Dysphagia, unspecified: Secondary | ICD-10-CM | POA: Diagnosis not present

## 2016-09-11 DIAGNOSIS — I10 Essential (primary) hypertension: Secondary | ICD-10-CM | POA: Diagnosis not present

## 2016-09-17 DIAGNOSIS — M109 Gout, unspecified: Secondary | ICD-10-CM | POA: Diagnosis not present

## 2016-09-17 DIAGNOSIS — G2581 Restless legs syndrome: Secondary | ICD-10-CM | POA: Diagnosis not present

## 2016-09-17 DIAGNOSIS — I10 Essential (primary) hypertension: Secondary | ICD-10-CM | POA: Diagnosis not present

## 2016-09-17 DIAGNOSIS — G309 Alzheimer's disease, unspecified: Secondary | ICD-10-CM | POA: Diagnosis not present

## 2016-09-17 DIAGNOSIS — E785 Hyperlipidemia, unspecified: Secondary | ICD-10-CM | POA: Diagnosis not present

## 2016-09-17 DIAGNOSIS — R531 Weakness: Secondary | ICD-10-CM | POA: Diagnosis not present

## 2016-09-24 DIAGNOSIS — E785 Hyperlipidemia, unspecified: Secondary | ICD-10-CM | POA: Diagnosis not present

## 2016-09-24 DIAGNOSIS — M109 Gout, unspecified: Secondary | ICD-10-CM | POA: Diagnosis not present

## 2016-09-24 DIAGNOSIS — R531 Weakness: Secondary | ICD-10-CM | POA: Diagnosis not present

## 2016-09-24 DIAGNOSIS — G2581 Restless legs syndrome: Secondary | ICD-10-CM | POA: Diagnosis not present

## 2016-09-24 DIAGNOSIS — G309 Alzheimer's disease, unspecified: Secondary | ICD-10-CM | POA: Diagnosis not present

## 2016-09-24 DIAGNOSIS — I10 Essential (primary) hypertension: Secondary | ICD-10-CM | POA: Diagnosis not present

## 2016-09-27 DIAGNOSIS — R1310 Dysphagia, unspecified: Secondary | ICD-10-CM | POA: Diagnosis not present

## 2016-09-27 DIAGNOSIS — R0602 Shortness of breath: Secondary | ICD-10-CM | POA: Diagnosis not present

## 2016-09-27 DIAGNOSIS — M109 Gout, unspecified: Secondary | ICD-10-CM | POA: Diagnosis not present

## 2016-09-27 DIAGNOSIS — R531 Weakness: Secondary | ICD-10-CM | POA: Diagnosis not present

## 2016-09-27 DIAGNOSIS — I10 Essential (primary) hypertension: Secondary | ICD-10-CM | POA: Diagnosis not present

## 2016-09-27 DIAGNOSIS — G309 Alzheimer's disease, unspecified: Secondary | ICD-10-CM | POA: Diagnosis not present

## 2016-09-27 DIAGNOSIS — G2581 Restless legs syndrome: Secondary | ICD-10-CM | POA: Diagnosis not present

## 2016-09-27 DIAGNOSIS — E785 Hyperlipidemia, unspecified: Secondary | ICD-10-CM | POA: Diagnosis not present

## 2016-09-28 DIAGNOSIS — R531 Weakness: Secondary | ICD-10-CM | POA: Diagnosis not present

## 2016-09-28 DIAGNOSIS — I10 Essential (primary) hypertension: Secondary | ICD-10-CM | POA: Diagnosis not present

## 2016-09-28 DIAGNOSIS — M109 Gout, unspecified: Secondary | ICD-10-CM | POA: Diagnosis not present

## 2016-09-28 DIAGNOSIS — G2581 Restless legs syndrome: Secondary | ICD-10-CM | POA: Diagnosis not present

## 2016-09-28 DIAGNOSIS — E785 Hyperlipidemia, unspecified: Secondary | ICD-10-CM | POA: Diagnosis not present

## 2016-09-28 DIAGNOSIS — G309 Alzheimer's disease, unspecified: Secondary | ICD-10-CM | POA: Diagnosis not present

## 2016-10-02 DIAGNOSIS — E785 Hyperlipidemia, unspecified: Secondary | ICD-10-CM | POA: Diagnosis not present

## 2016-10-02 DIAGNOSIS — M109 Gout, unspecified: Secondary | ICD-10-CM | POA: Diagnosis not present

## 2016-10-02 DIAGNOSIS — G309 Alzheimer's disease, unspecified: Secondary | ICD-10-CM | POA: Diagnosis not present

## 2016-10-02 DIAGNOSIS — G2581 Restless legs syndrome: Secondary | ICD-10-CM | POA: Diagnosis not present

## 2016-10-02 DIAGNOSIS — I10 Essential (primary) hypertension: Secondary | ICD-10-CM | POA: Diagnosis not present

## 2016-10-02 DIAGNOSIS — R531 Weakness: Secondary | ICD-10-CM | POA: Diagnosis not present

## 2016-10-03 DIAGNOSIS — G2581 Restless legs syndrome: Secondary | ICD-10-CM | POA: Diagnosis not present

## 2016-10-03 DIAGNOSIS — I10 Essential (primary) hypertension: Secondary | ICD-10-CM | POA: Diagnosis not present

## 2016-10-03 DIAGNOSIS — M109 Gout, unspecified: Secondary | ICD-10-CM | POA: Diagnosis not present

## 2016-10-03 DIAGNOSIS — E785 Hyperlipidemia, unspecified: Secondary | ICD-10-CM | POA: Diagnosis not present

## 2016-10-03 DIAGNOSIS — R531 Weakness: Secondary | ICD-10-CM | POA: Diagnosis not present

## 2016-10-03 DIAGNOSIS — G309 Alzheimer's disease, unspecified: Secondary | ICD-10-CM | POA: Diagnosis not present

## 2016-10-04 DIAGNOSIS — E785 Hyperlipidemia, unspecified: Secondary | ICD-10-CM | POA: Diagnosis not present

## 2016-10-04 DIAGNOSIS — G309 Alzheimer's disease, unspecified: Secondary | ICD-10-CM | POA: Diagnosis not present

## 2016-10-04 DIAGNOSIS — M109 Gout, unspecified: Secondary | ICD-10-CM | POA: Diagnosis not present

## 2016-10-04 DIAGNOSIS — R531 Weakness: Secondary | ICD-10-CM | POA: Diagnosis not present

## 2016-10-04 DIAGNOSIS — G2581 Restless legs syndrome: Secondary | ICD-10-CM | POA: Diagnosis not present

## 2016-10-04 DIAGNOSIS — I10 Essential (primary) hypertension: Secondary | ICD-10-CM | POA: Diagnosis not present

## 2016-10-05 DIAGNOSIS — M109 Gout, unspecified: Secondary | ICD-10-CM | POA: Diagnosis not present

## 2016-10-05 DIAGNOSIS — E785 Hyperlipidemia, unspecified: Secondary | ICD-10-CM | POA: Diagnosis not present

## 2016-10-05 DIAGNOSIS — I10 Essential (primary) hypertension: Secondary | ICD-10-CM | POA: Diagnosis not present

## 2016-10-05 DIAGNOSIS — G2581 Restless legs syndrome: Secondary | ICD-10-CM | POA: Diagnosis not present

## 2016-10-05 DIAGNOSIS — R531 Weakness: Secondary | ICD-10-CM | POA: Diagnosis not present

## 2016-10-05 DIAGNOSIS — G309 Alzheimer's disease, unspecified: Secondary | ICD-10-CM | POA: Diagnosis not present

## 2016-10-09 DIAGNOSIS — M109 Gout, unspecified: Secondary | ICD-10-CM | POA: Diagnosis not present

## 2016-10-09 DIAGNOSIS — G2581 Restless legs syndrome: Secondary | ICD-10-CM | POA: Diagnosis not present

## 2016-10-09 DIAGNOSIS — R531 Weakness: Secondary | ICD-10-CM | POA: Diagnosis not present

## 2016-10-09 DIAGNOSIS — E785 Hyperlipidemia, unspecified: Secondary | ICD-10-CM | POA: Diagnosis not present

## 2016-10-09 DIAGNOSIS — I10 Essential (primary) hypertension: Secondary | ICD-10-CM | POA: Diagnosis not present

## 2016-10-09 DIAGNOSIS — G309 Alzheimer's disease, unspecified: Secondary | ICD-10-CM | POA: Diagnosis not present

## 2016-10-10 ENCOUNTER — Ambulatory Visit: Payer: Medicare Other | Admitting: Internal Medicine

## 2016-10-10 DIAGNOSIS — M109 Gout, unspecified: Secondary | ICD-10-CM | POA: Diagnosis not present

## 2016-10-10 DIAGNOSIS — G309 Alzheimer's disease, unspecified: Secondary | ICD-10-CM | POA: Diagnosis not present

## 2016-10-10 DIAGNOSIS — E785 Hyperlipidemia, unspecified: Secondary | ICD-10-CM | POA: Diagnosis not present

## 2016-10-10 DIAGNOSIS — I10 Essential (primary) hypertension: Secondary | ICD-10-CM | POA: Diagnosis not present

## 2016-10-10 DIAGNOSIS — R531 Weakness: Secondary | ICD-10-CM | POA: Diagnosis not present

## 2016-10-10 DIAGNOSIS — G2581 Restless legs syndrome: Secondary | ICD-10-CM | POA: Diagnosis not present

## 2016-10-11 DIAGNOSIS — R531 Weakness: Secondary | ICD-10-CM | POA: Diagnosis not present

## 2016-10-11 DIAGNOSIS — I10 Essential (primary) hypertension: Secondary | ICD-10-CM | POA: Diagnosis not present

## 2016-10-11 DIAGNOSIS — E785 Hyperlipidemia, unspecified: Secondary | ICD-10-CM | POA: Diagnosis not present

## 2016-10-11 DIAGNOSIS — M109 Gout, unspecified: Secondary | ICD-10-CM | POA: Diagnosis not present

## 2016-10-11 DIAGNOSIS — G2581 Restless legs syndrome: Secondary | ICD-10-CM | POA: Diagnosis not present

## 2016-10-11 DIAGNOSIS — G309 Alzheimer's disease, unspecified: Secondary | ICD-10-CM | POA: Diagnosis not present

## 2016-10-16 DIAGNOSIS — E785 Hyperlipidemia, unspecified: Secondary | ICD-10-CM | POA: Diagnosis not present

## 2016-10-16 DIAGNOSIS — G2581 Restless legs syndrome: Secondary | ICD-10-CM | POA: Diagnosis not present

## 2016-10-16 DIAGNOSIS — I10 Essential (primary) hypertension: Secondary | ICD-10-CM | POA: Diagnosis not present

## 2016-10-16 DIAGNOSIS — R531 Weakness: Secondary | ICD-10-CM | POA: Diagnosis not present

## 2016-10-16 DIAGNOSIS — G309 Alzheimer's disease, unspecified: Secondary | ICD-10-CM | POA: Diagnosis not present

## 2016-10-16 DIAGNOSIS — M109 Gout, unspecified: Secondary | ICD-10-CM | POA: Diagnosis not present

## 2016-10-17 DIAGNOSIS — G309 Alzheimer's disease, unspecified: Secondary | ICD-10-CM | POA: Diagnosis not present

## 2016-10-17 DIAGNOSIS — M109 Gout, unspecified: Secondary | ICD-10-CM | POA: Diagnosis not present

## 2016-10-17 DIAGNOSIS — I10 Essential (primary) hypertension: Secondary | ICD-10-CM | POA: Diagnosis not present

## 2016-10-17 DIAGNOSIS — E785 Hyperlipidemia, unspecified: Secondary | ICD-10-CM | POA: Diagnosis not present

## 2016-10-17 DIAGNOSIS — G2581 Restless legs syndrome: Secondary | ICD-10-CM | POA: Diagnosis not present

## 2016-10-17 DIAGNOSIS — R531 Weakness: Secondary | ICD-10-CM | POA: Diagnosis not present

## 2016-10-18 DIAGNOSIS — E785 Hyperlipidemia, unspecified: Secondary | ICD-10-CM | POA: Diagnosis not present

## 2016-10-18 DIAGNOSIS — G2581 Restless legs syndrome: Secondary | ICD-10-CM | POA: Diagnosis not present

## 2016-10-18 DIAGNOSIS — I10 Essential (primary) hypertension: Secondary | ICD-10-CM | POA: Diagnosis not present

## 2016-10-18 DIAGNOSIS — M109 Gout, unspecified: Secondary | ICD-10-CM | POA: Diagnosis not present

## 2016-10-18 DIAGNOSIS — G309 Alzheimer's disease, unspecified: Secondary | ICD-10-CM | POA: Diagnosis not present

## 2016-10-18 DIAGNOSIS — R531 Weakness: Secondary | ICD-10-CM | POA: Diagnosis not present

## 2016-10-19 ENCOUNTER — Ambulatory Visit: Payer: Medicare Other | Admitting: Internal Medicine

## 2016-10-23 DIAGNOSIS — G309 Alzheimer's disease, unspecified: Secondary | ICD-10-CM | POA: Diagnosis not present

## 2016-10-23 DIAGNOSIS — R531 Weakness: Secondary | ICD-10-CM | POA: Diagnosis not present

## 2016-10-23 DIAGNOSIS — G2581 Restless legs syndrome: Secondary | ICD-10-CM | POA: Diagnosis not present

## 2016-10-23 DIAGNOSIS — M109 Gout, unspecified: Secondary | ICD-10-CM | POA: Diagnosis not present

## 2016-10-23 DIAGNOSIS — I10 Essential (primary) hypertension: Secondary | ICD-10-CM | POA: Diagnosis not present

## 2016-10-23 DIAGNOSIS — E785 Hyperlipidemia, unspecified: Secondary | ICD-10-CM | POA: Diagnosis not present

## 2016-10-24 DIAGNOSIS — R531 Weakness: Secondary | ICD-10-CM | POA: Diagnosis not present

## 2016-10-24 DIAGNOSIS — E785 Hyperlipidemia, unspecified: Secondary | ICD-10-CM | POA: Diagnosis not present

## 2016-10-24 DIAGNOSIS — G309 Alzheimer's disease, unspecified: Secondary | ICD-10-CM | POA: Diagnosis not present

## 2016-10-24 DIAGNOSIS — M109 Gout, unspecified: Secondary | ICD-10-CM | POA: Diagnosis not present

## 2016-10-24 DIAGNOSIS — I10 Essential (primary) hypertension: Secondary | ICD-10-CM | POA: Diagnosis not present

## 2016-10-24 DIAGNOSIS — G2581 Restless legs syndrome: Secondary | ICD-10-CM | POA: Diagnosis not present

## 2016-10-25 DIAGNOSIS — G2581 Restless legs syndrome: Secondary | ICD-10-CM | POA: Diagnosis not present

## 2016-10-25 DIAGNOSIS — I10 Essential (primary) hypertension: Secondary | ICD-10-CM | POA: Diagnosis not present

## 2016-10-25 DIAGNOSIS — G309 Alzheimer's disease, unspecified: Secondary | ICD-10-CM | POA: Diagnosis not present

## 2016-10-25 DIAGNOSIS — E785 Hyperlipidemia, unspecified: Secondary | ICD-10-CM | POA: Diagnosis not present

## 2016-10-25 DIAGNOSIS — R0602 Shortness of breath: Secondary | ICD-10-CM | POA: Diagnosis not present

## 2016-10-25 DIAGNOSIS — R531 Weakness: Secondary | ICD-10-CM | POA: Diagnosis not present

## 2016-10-25 DIAGNOSIS — M109 Gout, unspecified: Secondary | ICD-10-CM | POA: Diagnosis not present

## 2016-10-25 DIAGNOSIS — R1310 Dysphagia, unspecified: Secondary | ICD-10-CM | POA: Diagnosis not present

## 2016-10-30 DIAGNOSIS — G2581 Restless legs syndrome: Secondary | ICD-10-CM | POA: Diagnosis not present

## 2016-10-30 DIAGNOSIS — R531 Weakness: Secondary | ICD-10-CM | POA: Diagnosis not present

## 2016-10-30 DIAGNOSIS — M109 Gout, unspecified: Secondary | ICD-10-CM | POA: Diagnosis not present

## 2016-10-30 DIAGNOSIS — E785 Hyperlipidemia, unspecified: Secondary | ICD-10-CM | POA: Diagnosis not present

## 2016-10-30 DIAGNOSIS — I10 Essential (primary) hypertension: Secondary | ICD-10-CM | POA: Diagnosis not present

## 2016-10-30 DIAGNOSIS — G309 Alzheimer's disease, unspecified: Secondary | ICD-10-CM | POA: Diagnosis not present

## 2016-10-31 DIAGNOSIS — G2581 Restless legs syndrome: Secondary | ICD-10-CM | POA: Diagnosis not present

## 2016-10-31 DIAGNOSIS — E785 Hyperlipidemia, unspecified: Secondary | ICD-10-CM | POA: Diagnosis not present

## 2016-10-31 DIAGNOSIS — I10 Essential (primary) hypertension: Secondary | ICD-10-CM | POA: Diagnosis not present

## 2016-10-31 DIAGNOSIS — G309 Alzheimer's disease, unspecified: Secondary | ICD-10-CM | POA: Diagnosis not present

## 2016-10-31 DIAGNOSIS — R531 Weakness: Secondary | ICD-10-CM | POA: Diagnosis not present

## 2016-10-31 DIAGNOSIS — M109 Gout, unspecified: Secondary | ICD-10-CM | POA: Diagnosis not present

## 2016-11-01 DIAGNOSIS — G309 Alzheimer's disease, unspecified: Secondary | ICD-10-CM | POA: Diagnosis not present

## 2016-11-01 DIAGNOSIS — M109 Gout, unspecified: Secondary | ICD-10-CM | POA: Diagnosis not present

## 2016-11-01 DIAGNOSIS — I10 Essential (primary) hypertension: Secondary | ICD-10-CM | POA: Diagnosis not present

## 2016-11-01 DIAGNOSIS — R531 Weakness: Secondary | ICD-10-CM | POA: Diagnosis not present

## 2016-11-01 DIAGNOSIS — G2581 Restless legs syndrome: Secondary | ICD-10-CM | POA: Diagnosis not present

## 2016-11-01 DIAGNOSIS — E785 Hyperlipidemia, unspecified: Secondary | ICD-10-CM | POA: Diagnosis not present

## 2016-11-07 DIAGNOSIS — M109 Gout, unspecified: Secondary | ICD-10-CM | POA: Diagnosis not present

## 2016-11-07 DIAGNOSIS — R531 Weakness: Secondary | ICD-10-CM | POA: Diagnosis not present

## 2016-11-07 DIAGNOSIS — G309 Alzheimer's disease, unspecified: Secondary | ICD-10-CM | POA: Diagnosis not present

## 2016-11-07 DIAGNOSIS — G2581 Restless legs syndrome: Secondary | ICD-10-CM | POA: Diagnosis not present

## 2016-11-07 DIAGNOSIS — E785 Hyperlipidemia, unspecified: Secondary | ICD-10-CM | POA: Diagnosis not present

## 2016-11-07 DIAGNOSIS — I10 Essential (primary) hypertension: Secondary | ICD-10-CM | POA: Diagnosis not present

## 2016-11-13 DIAGNOSIS — G2581 Restless legs syndrome: Secondary | ICD-10-CM | POA: Diagnosis not present

## 2016-11-13 DIAGNOSIS — E785 Hyperlipidemia, unspecified: Secondary | ICD-10-CM | POA: Diagnosis not present

## 2016-11-13 DIAGNOSIS — G309 Alzheimer's disease, unspecified: Secondary | ICD-10-CM | POA: Diagnosis not present

## 2016-11-13 DIAGNOSIS — I10 Essential (primary) hypertension: Secondary | ICD-10-CM | POA: Diagnosis not present

## 2016-11-13 DIAGNOSIS — R531 Weakness: Secondary | ICD-10-CM | POA: Diagnosis not present

## 2016-11-13 DIAGNOSIS — M109 Gout, unspecified: Secondary | ICD-10-CM | POA: Diagnosis not present

## 2016-11-15 DIAGNOSIS — G309 Alzheimer's disease, unspecified: Secondary | ICD-10-CM | POA: Diagnosis not present

## 2016-11-15 DIAGNOSIS — M109 Gout, unspecified: Secondary | ICD-10-CM | POA: Diagnosis not present

## 2016-11-15 DIAGNOSIS — R531 Weakness: Secondary | ICD-10-CM | POA: Diagnosis not present

## 2016-11-15 DIAGNOSIS — I10 Essential (primary) hypertension: Secondary | ICD-10-CM | POA: Diagnosis not present

## 2016-11-15 DIAGNOSIS — G2581 Restless legs syndrome: Secondary | ICD-10-CM | POA: Diagnosis not present

## 2016-11-15 DIAGNOSIS — E785 Hyperlipidemia, unspecified: Secondary | ICD-10-CM | POA: Diagnosis not present

## 2016-11-18 DIAGNOSIS — Z950 Presence of cardiac pacemaker: Secondary | ICD-10-CM | POA: Diagnosis not present

## 2016-11-18 DIAGNOSIS — I495 Sick sinus syndrome: Secondary | ICD-10-CM | POA: Diagnosis not present

## 2016-11-18 DIAGNOSIS — I2510 Atherosclerotic heart disease of native coronary artery without angina pectoris: Secondary | ICD-10-CM | POA: Diagnosis not present

## 2016-11-18 DIAGNOSIS — I10 Essential (primary) hypertension: Secondary | ICD-10-CM | POA: Diagnosis not present

## 2016-11-18 DIAGNOSIS — E785 Hyperlipidemia, unspecified: Secondary | ICD-10-CM | POA: Diagnosis not present

## 2016-11-20 DIAGNOSIS — G309 Alzheimer's disease, unspecified: Secondary | ICD-10-CM | POA: Diagnosis not present

## 2016-11-20 DIAGNOSIS — I10 Essential (primary) hypertension: Secondary | ICD-10-CM | POA: Diagnosis not present

## 2016-11-20 DIAGNOSIS — G2581 Restless legs syndrome: Secondary | ICD-10-CM | POA: Diagnosis not present

## 2016-11-20 DIAGNOSIS — E785 Hyperlipidemia, unspecified: Secondary | ICD-10-CM | POA: Diagnosis not present

## 2016-11-20 DIAGNOSIS — M109 Gout, unspecified: Secondary | ICD-10-CM | POA: Diagnosis not present

## 2016-11-20 DIAGNOSIS — R531 Weakness: Secondary | ICD-10-CM | POA: Diagnosis not present

## 2016-11-22 DIAGNOSIS — R531 Weakness: Secondary | ICD-10-CM | POA: Diagnosis not present

## 2016-11-22 DIAGNOSIS — I10 Essential (primary) hypertension: Secondary | ICD-10-CM | POA: Diagnosis not present

## 2016-11-22 DIAGNOSIS — M109 Gout, unspecified: Secondary | ICD-10-CM | POA: Diagnosis not present

## 2016-11-22 DIAGNOSIS — G2581 Restless legs syndrome: Secondary | ICD-10-CM | POA: Diagnosis not present

## 2016-11-22 DIAGNOSIS — G309 Alzheimer's disease, unspecified: Secondary | ICD-10-CM | POA: Diagnosis not present

## 2016-11-22 DIAGNOSIS — E785 Hyperlipidemia, unspecified: Secondary | ICD-10-CM | POA: Diagnosis not present

## 2016-11-25 DIAGNOSIS — E785 Hyperlipidemia, unspecified: Secondary | ICD-10-CM | POA: Diagnosis not present

## 2016-11-25 DIAGNOSIS — G2581 Restless legs syndrome: Secondary | ICD-10-CM | POA: Diagnosis not present

## 2016-11-25 DIAGNOSIS — R531 Weakness: Secondary | ICD-10-CM | POA: Diagnosis not present

## 2016-11-25 DIAGNOSIS — I10 Essential (primary) hypertension: Secondary | ICD-10-CM | POA: Diagnosis not present

## 2016-11-25 DIAGNOSIS — G309 Alzheimer's disease, unspecified: Secondary | ICD-10-CM | POA: Diagnosis not present

## 2016-11-25 DIAGNOSIS — R0602 Shortness of breath: Secondary | ICD-10-CM | POA: Diagnosis not present

## 2016-11-25 DIAGNOSIS — M109 Gout, unspecified: Secondary | ICD-10-CM | POA: Diagnosis not present

## 2016-11-25 DIAGNOSIS — R1310 Dysphagia, unspecified: Secondary | ICD-10-CM | POA: Diagnosis not present

## 2016-11-27 DIAGNOSIS — G2581 Restless legs syndrome: Secondary | ICD-10-CM | POA: Diagnosis not present

## 2016-11-27 DIAGNOSIS — R531 Weakness: Secondary | ICD-10-CM | POA: Diagnosis not present

## 2016-11-27 DIAGNOSIS — E785 Hyperlipidemia, unspecified: Secondary | ICD-10-CM | POA: Diagnosis not present

## 2016-11-27 DIAGNOSIS — M109 Gout, unspecified: Secondary | ICD-10-CM | POA: Diagnosis not present

## 2016-11-27 DIAGNOSIS — I10 Essential (primary) hypertension: Secondary | ICD-10-CM | POA: Diagnosis not present

## 2016-11-27 DIAGNOSIS — G309 Alzheimer's disease, unspecified: Secondary | ICD-10-CM | POA: Diagnosis not present

## 2016-11-28 DIAGNOSIS — E785 Hyperlipidemia, unspecified: Secondary | ICD-10-CM | POA: Diagnosis not present

## 2016-11-28 DIAGNOSIS — R531 Weakness: Secondary | ICD-10-CM | POA: Diagnosis not present

## 2016-11-28 DIAGNOSIS — G309 Alzheimer's disease, unspecified: Secondary | ICD-10-CM | POA: Diagnosis not present

## 2016-11-28 DIAGNOSIS — I10 Essential (primary) hypertension: Secondary | ICD-10-CM | POA: Diagnosis not present

## 2016-11-28 DIAGNOSIS — M109 Gout, unspecified: Secondary | ICD-10-CM | POA: Diagnosis not present

## 2016-11-28 DIAGNOSIS — G2581 Restless legs syndrome: Secondary | ICD-10-CM | POA: Diagnosis not present

## 2016-11-29 DIAGNOSIS — G309 Alzheimer's disease, unspecified: Secondary | ICD-10-CM | POA: Diagnosis not present

## 2016-11-29 DIAGNOSIS — E785 Hyperlipidemia, unspecified: Secondary | ICD-10-CM | POA: Diagnosis not present

## 2016-11-29 DIAGNOSIS — R531 Weakness: Secondary | ICD-10-CM | POA: Diagnosis not present

## 2016-11-29 DIAGNOSIS — M109 Gout, unspecified: Secondary | ICD-10-CM | POA: Diagnosis not present

## 2016-11-29 DIAGNOSIS — I10 Essential (primary) hypertension: Secondary | ICD-10-CM | POA: Diagnosis not present

## 2016-11-29 DIAGNOSIS — G2581 Restless legs syndrome: Secondary | ICD-10-CM | POA: Diagnosis not present

## 2016-12-04 DIAGNOSIS — E785 Hyperlipidemia, unspecified: Secondary | ICD-10-CM | POA: Diagnosis not present

## 2016-12-04 DIAGNOSIS — G309 Alzheimer's disease, unspecified: Secondary | ICD-10-CM | POA: Diagnosis not present

## 2016-12-04 DIAGNOSIS — M109 Gout, unspecified: Secondary | ICD-10-CM | POA: Diagnosis not present

## 2016-12-04 DIAGNOSIS — I10 Essential (primary) hypertension: Secondary | ICD-10-CM | POA: Diagnosis not present

## 2016-12-04 DIAGNOSIS — G2581 Restless legs syndrome: Secondary | ICD-10-CM | POA: Diagnosis not present

## 2016-12-04 DIAGNOSIS — R531 Weakness: Secondary | ICD-10-CM | POA: Diagnosis not present

## 2016-12-05 DIAGNOSIS — M109 Gout, unspecified: Secondary | ICD-10-CM | POA: Diagnosis not present

## 2016-12-05 DIAGNOSIS — I10 Essential (primary) hypertension: Secondary | ICD-10-CM | POA: Diagnosis not present

## 2016-12-05 DIAGNOSIS — G309 Alzheimer's disease, unspecified: Secondary | ICD-10-CM | POA: Diagnosis not present

## 2016-12-05 DIAGNOSIS — G2581 Restless legs syndrome: Secondary | ICD-10-CM | POA: Diagnosis not present

## 2016-12-05 DIAGNOSIS — R531 Weakness: Secondary | ICD-10-CM | POA: Diagnosis not present

## 2016-12-05 DIAGNOSIS — E785 Hyperlipidemia, unspecified: Secondary | ICD-10-CM | POA: Diagnosis not present

## 2016-12-06 DIAGNOSIS — M109 Gout, unspecified: Secondary | ICD-10-CM | POA: Diagnosis not present

## 2016-12-06 DIAGNOSIS — G2581 Restless legs syndrome: Secondary | ICD-10-CM | POA: Diagnosis not present

## 2016-12-06 DIAGNOSIS — G309 Alzheimer's disease, unspecified: Secondary | ICD-10-CM | POA: Diagnosis not present

## 2016-12-06 DIAGNOSIS — E785 Hyperlipidemia, unspecified: Secondary | ICD-10-CM | POA: Diagnosis not present

## 2016-12-06 DIAGNOSIS — I10 Essential (primary) hypertension: Secondary | ICD-10-CM | POA: Diagnosis not present

## 2016-12-06 DIAGNOSIS — R531 Weakness: Secondary | ICD-10-CM | POA: Diagnosis not present

## 2016-12-11 DIAGNOSIS — G309 Alzheimer's disease, unspecified: Secondary | ICD-10-CM | POA: Diagnosis not present

## 2016-12-11 DIAGNOSIS — E785 Hyperlipidemia, unspecified: Secondary | ICD-10-CM | POA: Diagnosis not present

## 2016-12-11 DIAGNOSIS — R531 Weakness: Secondary | ICD-10-CM | POA: Diagnosis not present

## 2016-12-11 DIAGNOSIS — M109 Gout, unspecified: Secondary | ICD-10-CM | POA: Diagnosis not present

## 2016-12-11 DIAGNOSIS — I10 Essential (primary) hypertension: Secondary | ICD-10-CM | POA: Diagnosis not present

## 2016-12-11 DIAGNOSIS — G2581 Restless legs syndrome: Secondary | ICD-10-CM | POA: Diagnosis not present

## 2016-12-12 DIAGNOSIS — G309 Alzheimer's disease, unspecified: Secondary | ICD-10-CM | POA: Diagnosis not present

## 2016-12-12 DIAGNOSIS — G2581 Restless legs syndrome: Secondary | ICD-10-CM | POA: Diagnosis not present

## 2016-12-12 DIAGNOSIS — I10 Essential (primary) hypertension: Secondary | ICD-10-CM | POA: Diagnosis not present

## 2016-12-12 DIAGNOSIS — R531 Weakness: Secondary | ICD-10-CM | POA: Diagnosis not present

## 2016-12-12 DIAGNOSIS — E785 Hyperlipidemia, unspecified: Secondary | ICD-10-CM | POA: Diagnosis not present

## 2016-12-12 DIAGNOSIS — M109 Gout, unspecified: Secondary | ICD-10-CM | POA: Diagnosis not present

## 2016-12-19 DIAGNOSIS — G2581 Restless legs syndrome: Secondary | ICD-10-CM | POA: Diagnosis not present

## 2016-12-19 DIAGNOSIS — G309 Alzheimer's disease, unspecified: Secondary | ICD-10-CM | POA: Diagnosis not present

## 2016-12-19 DIAGNOSIS — R531 Weakness: Secondary | ICD-10-CM | POA: Diagnosis not present

## 2016-12-19 DIAGNOSIS — E785 Hyperlipidemia, unspecified: Secondary | ICD-10-CM | POA: Diagnosis not present

## 2016-12-19 DIAGNOSIS — I10 Essential (primary) hypertension: Secondary | ICD-10-CM | POA: Diagnosis not present

## 2016-12-19 DIAGNOSIS — M109 Gout, unspecified: Secondary | ICD-10-CM | POA: Diagnosis not present

## 2016-12-25 DIAGNOSIS — M109 Gout, unspecified: Secondary | ICD-10-CM | POA: Diagnosis not present

## 2016-12-25 DIAGNOSIS — E785 Hyperlipidemia, unspecified: Secondary | ICD-10-CM | POA: Diagnosis not present

## 2016-12-25 DIAGNOSIS — R531 Weakness: Secondary | ICD-10-CM | POA: Diagnosis not present

## 2016-12-25 DIAGNOSIS — R0602 Shortness of breath: Secondary | ICD-10-CM | POA: Diagnosis not present

## 2016-12-25 DIAGNOSIS — G2581 Restless legs syndrome: Secondary | ICD-10-CM | POA: Diagnosis not present

## 2016-12-25 DIAGNOSIS — G309 Alzheimer's disease, unspecified: Secondary | ICD-10-CM | POA: Diagnosis not present

## 2016-12-25 DIAGNOSIS — I10 Essential (primary) hypertension: Secondary | ICD-10-CM | POA: Diagnosis not present

## 2016-12-25 DIAGNOSIS — R1310 Dysphagia, unspecified: Secondary | ICD-10-CM | POA: Diagnosis not present

## 2016-12-26 DIAGNOSIS — M109 Gout, unspecified: Secondary | ICD-10-CM | POA: Diagnosis not present

## 2016-12-26 DIAGNOSIS — G309 Alzheimer's disease, unspecified: Secondary | ICD-10-CM | POA: Diagnosis not present

## 2016-12-26 DIAGNOSIS — G2581 Restless legs syndrome: Secondary | ICD-10-CM | POA: Diagnosis not present

## 2016-12-26 DIAGNOSIS — E785 Hyperlipidemia, unspecified: Secondary | ICD-10-CM | POA: Diagnosis not present

## 2016-12-26 DIAGNOSIS — R531 Weakness: Secondary | ICD-10-CM | POA: Diagnosis not present

## 2016-12-26 DIAGNOSIS — I10 Essential (primary) hypertension: Secondary | ICD-10-CM | POA: Diagnosis not present

## 2016-12-31 DIAGNOSIS — I10 Essential (primary) hypertension: Secondary | ICD-10-CM | POA: Diagnosis not present

## 2016-12-31 DIAGNOSIS — G309 Alzheimer's disease, unspecified: Secondary | ICD-10-CM | POA: Diagnosis not present

## 2016-12-31 DIAGNOSIS — R531 Weakness: Secondary | ICD-10-CM | POA: Diagnosis not present

## 2016-12-31 DIAGNOSIS — M109 Gout, unspecified: Secondary | ICD-10-CM | POA: Diagnosis not present

## 2016-12-31 DIAGNOSIS — E785 Hyperlipidemia, unspecified: Secondary | ICD-10-CM | POA: Diagnosis not present

## 2016-12-31 DIAGNOSIS — G2581 Restless legs syndrome: Secondary | ICD-10-CM | POA: Diagnosis not present

## 2017-01-01 DIAGNOSIS — G2581 Restless legs syndrome: Secondary | ICD-10-CM | POA: Diagnosis not present

## 2017-01-01 DIAGNOSIS — E785 Hyperlipidemia, unspecified: Secondary | ICD-10-CM | POA: Diagnosis not present

## 2017-01-01 DIAGNOSIS — R531 Weakness: Secondary | ICD-10-CM | POA: Diagnosis not present

## 2017-01-01 DIAGNOSIS — I10 Essential (primary) hypertension: Secondary | ICD-10-CM | POA: Diagnosis not present

## 2017-01-01 DIAGNOSIS — G309 Alzheimer's disease, unspecified: Secondary | ICD-10-CM | POA: Diagnosis not present

## 2017-01-01 DIAGNOSIS — M109 Gout, unspecified: Secondary | ICD-10-CM | POA: Diagnosis not present

## 2017-01-02 DIAGNOSIS — E785 Hyperlipidemia, unspecified: Secondary | ICD-10-CM | POA: Diagnosis not present

## 2017-01-02 DIAGNOSIS — G2581 Restless legs syndrome: Secondary | ICD-10-CM | POA: Diagnosis not present

## 2017-01-02 DIAGNOSIS — R531 Weakness: Secondary | ICD-10-CM | POA: Diagnosis not present

## 2017-01-02 DIAGNOSIS — M109 Gout, unspecified: Secondary | ICD-10-CM | POA: Diagnosis not present

## 2017-01-02 DIAGNOSIS — G309 Alzheimer's disease, unspecified: Secondary | ICD-10-CM | POA: Diagnosis not present

## 2017-01-02 DIAGNOSIS — I10 Essential (primary) hypertension: Secondary | ICD-10-CM | POA: Diagnosis not present

## 2017-01-03 DIAGNOSIS — M109 Gout, unspecified: Secondary | ICD-10-CM | POA: Diagnosis not present

## 2017-01-03 DIAGNOSIS — I10 Essential (primary) hypertension: Secondary | ICD-10-CM | POA: Diagnosis not present

## 2017-01-03 DIAGNOSIS — G309 Alzheimer's disease, unspecified: Secondary | ICD-10-CM | POA: Diagnosis not present

## 2017-01-03 DIAGNOSIS — R531 Weakness: Secondary | ICD-10-CM | POA: Diagnosis not present

## 2017-01-03 DIAGNOSIS — E785 Hyperlipidemia, unspecified: Secondary | ICD-10-CM | POA: Diagnosis not present

## 2017-01-03 DIAGNOSIS — G2581 Restless legs syndrome: Secondary | ICD-10-CM | POA: Diagnosis not present

## 2017-01-07 DIAGNOSIS — Z87442 Personal history of urinary calculi: Secondary | ICD-10-CM | POA: Diagnosis not present

## 2017-01-07 DIAGNOSIS — Z299 Encounter for prophylactic measures, unspecified: Secondary | ICD-10-CM | POA: Diagnosis not present

## 2017-01-07 DIAGNOSIS — E785 Hyperlipidemia, unspecified: Secondary | ICD-10-CM | POA: Diagnosis not present

## 2017-01-07 DIAGNOSIS — E7800 Pure hypercholesterolemia, unspecified: Secondary | ICD-10-CM | POA: Diagnosis not present

## 2017-01-07 DIAGNOSIS — R531 Weakness: Secondary | ICD-10-CM | POA: Diagnosis not present

## 2017-01-07 DIAGNOSIS — R69 Illness, unspecified: Secondary | ICD-10-CM | POA: Diagnosis not present

## 2017-01-07 DIAGNOSIS — Z79899 Other long term (current) drug therapy: Secondary | ICD-10-CM | POA: Diagnosis not present

## 2017-01-07 DIAGNOSIS — G309 Alzheimer's disease, unspecified: Secondary | ICD-10-CM | POA: Diagnosis not present

## 2017-01-07 DIAGNOSIS — N898 Other specified noninflammatory disorders of vagina: Secondary | ICD-10-CM | POA: Diagnosis not present

## 2017-01-07 DIAGNOSIS — I10 Essential (primary) hypertension: Secondary | ICD-10-CM | POA: Diagnosis not present

## 2017-01-07 DIAGNOSIS — M109 Gout, unspecified: Secondary | ICD-10-CM | POA: Diagnosis not present

## 2017-01-07 DIAGNOSIS — Z87440 Personal history of urinary (tract) infections: Secondary | ICD-10-CM | POA: Diagnosis not present

## 2017-01-07 DIAGNOSIS — M810 Age-related osteoporosis without current pathological fracture: Secondary | ICD-10-CM | POA: Diagnosis not present

## 2017-01-07 DIAGNOSIS — G2581 Restless legs syndrome: Secondary | ICD-10-CM | POA: Diagnosis not present

## 2017-01-07 DIAGNOSIS — N3000 Acute cystitis without hematuria: Secondary | ICD-10-CM | POA: Diagnosis not present

## 2017-01-07 DIAGNOSIS — F0390 Unspecified dementia without behavioral disturbance: Secondary | ICD-10-CM | POA: Diagnosis not present

## 2017-01-07 DIAGNOSIS — M1990 Unspecified osteoarthritis, unspecified site: Secondary | ICD-10-CM | POA: Diagnosis not present

## 2017-01-07 DIAGNOSIS — Z710 Person encountering health services to consult on behalf of another person: Secondary | ICD-10-CM | POA: Diagnosis not present

## 2017-01-07 DIAGNOSIS — F0280 Dementia in other diseases classified elsewhere without behavioral disturbance: Secondary | ICD-10-CM | POA: Diagnosis not present

## 2017-01-08 DIAGNOSIS — I10 Essential (primary) hypertension: Secondary | ICD-10-CM | POA: Diagnosis not present

## 2017-01-08 DIAGNOSIS — M109 Gout, unspecified: Secondary | ICD-10-CM | POA: Diagnosis not present

## 2017-01-08 DIAGNOSIS — R531 Weakness: Secondary | ICD-10-CM | POA: Diagnosis not present

## 2017-01-08 DIAGNOSIS — E785 Hyperlipidemia, unspecified: Secondary | ICD-10-CM | POA: Diagnosis not present

## 2017-01-08 DIAGNOSIS — G2581 Restless legs syndrome: Secondary | ICD-10-CM | POA: Diagnosis not present

## 2017-01-08 DIAGNOSIS — G309 Alzheimer's disease, unspecified: Secondary | ICD-10-CM | POA: Diagnosis not present

## 2017-01-16 DIAGNOSIS — I10 Essential (primary) hypertension: Secondary | ICD-10-CM | POA: Diagnosis not present

## 2017-01-16 DIAGNOSIS — Z299 Encounter for prophylactic measures, unspecified: Secondary | ICD-10-CM | POA: Diagnosis not present

## 2017-01-16 DIAGNOSIS — J45909 Unspecified asthma, uncomplicated: Secondary | ICD-10-CM | POA: Diagnosis not present

## 2017-01-16 DIAGNOSIS — L710 Perioral dermatitis: Secondary | ICD-10-CM | POA: Diagnosis not present

## 2017-01-16 DIAGNOSIS — G309 Alzheimer's disease, unspecified: Secondary | ICD-10-CM | POA: Diagnosis not present

## 2017-01-16 DIAGNOSIS — E7800 Pure hypercholesterolemia, unspecified: Secondary | ICD-10-CM | POA: Diagnosis not present

## 2017-01-16 DIAGNOSIS — R69 Illness, unspecified: Secondary | ICD-10-CM | POA: Diagnosis not present

## 2017-01-16 DIAGNOSIS — F0390 Unspecified dementia without behavioral disturbance: Secondary | ICD-10-CM | POA: Diagnosis not present

## 2017-01-16 DIAGNOSIS — F0280 Dementia in other diseases classified elsewhere without behavioral disturbance: Secondary | ICD-10-CM | POA: Diagnosis not present

## 2017-01-16 DIAGNOSIS — F419 Anxiety disorder, unspecified: Secondary | ICD-10-CM | POA: Diagnosis not present

## 2017-01-16 DIAGNOSIS — G2581 Restless legs syndrome: Secondary | ICD-10-CM | POA: Diagnosis not present

## 2017-01-26 DIAGNOSIS — J189 Pneumonia, unspecified organism: Secondary | ICD-10-CM | POA: Diagnosis not present

## 2017-01-26 DIAGNOSIS — M150 Primary generalized (osteo)arthritis: Secondary | ICD-10-CM | POA: Diagnosis not present

## 2017-01-26 DIAGNOSIS — G309 Alzheimer's disease, unspecified: Secondary | ICD-10-CM | POA: Diagnosis not present

## 2017-01-26 DIAGNOSIS — L8990 Pressure ulcer of unspecified site, unspecified stage: Secondary | ICD-10-CM | POA: Diagnosis not present

## 2017-01-26 DIAGNOSIS — I270 Primary pulmonary hypertension: Secondary | ICD-10-CM | POA: Diagnosis not present

## 2017-01-26 DIAGNOSIS — J441 Chronic obstructive pulmonary disease with (acute) exacerbation: Secondary | ICD-10-CM | POA: Diagnosis not present

## 2017-02-14 DIAGNOSIS — N952 Postmenopausal atrophic vaginitis: Secondary | ICD-10-CM | POA: Diagnosis not present

## 2017-02-14 DIAGNOSIS — N390 Urinary tract infection, site not specified: Secondary | ICD-10-CM | POA: Diagnosis not present

## 2017-02-25 DIAGNOSIS — M150 Primary generalized (osteo)arthritis: Secondary | ICD-10-CM | POA: Diagnosis not present

## 2017-02-25 DIAGNOSIS — L8990 Pressure ulcer of unspecified site, unspecified stage: Secondary | ICD-10-CM | POA: Diagnosis not present

## 2017-02-25 DIAGNOSIS — G309 Alzheimer's disease, unspecified: Secondary | ICD-10-CM | POA: Diagnosis not present

## 2017-02-25 DIAGNOSIS — I270 Primary pulmonary hypertension: Secondary | ICD-10-CM | POA: Diagnosis not present

## 2017-02-25 DIAGNOSIS — J189 Pneumonia, unspecified organism: Secondary | ICD-10-CM | POA: Diagnosis not present

## 2017-02-25 DIAGNOSIS — J441 Chronic obstructive pulmonary disease with (acute) exacerbation: Secondary | ICD-10-CM | POA: Diagnosis not present

## 2017-03-28 DIAGNOSIS — J441 Chronic obstructive pulmonary disease with (acute) exacerbation: Secondary | ICD-10-CM | POA: Diagnosis not present

## 2017-03-28 DIAGNOSIS — J189 Pneumonia, unspecified organism: Secondary | ICD-10-CM | POA: Diagnosis not present

## 2017-03-28 DIAGNOSIS — I270 Primary pulmonary hypertension: Secondary | ICD-10-CM | POA: Diagnosis not present

## 2017-03-28 DIAGNOSIS — M150 Primary generalized (osteo)arthritis: Secondary | ICD-10-CM | POA: Diagnosis not present

## 2017-03-28 DIAGNOSIS — G309 Alzheimer's disease, unspecified: Secondary | ICD-10-CM | POA: Diagnosis not present

## 2017-03-28 DIAGNOSIS — L8990 Pressure ulcer of unspecified site, unspecified stage: Secondary | ICD-10-CM | POA: Diagnosis not present

## 2017-04-13 DIAGNOSIS — R69 Illness, unspecified: Secondary | ICD-10-CM | POA: Diagnosis not present

## 2017-04-28 DIAGNOSIS — I270 Primary pulmonary hypertension: Secondary | ICD-10-CM | POA: Diagnosis not present

## 2017-04-28 DIAGNOSIS — J441 Chronic obstructive pulmonary disease with (acute) exacerbation: Secondary | ICD-10-CM | POA: Diagnosis not present

## 2017-04-28 DIAGNOSIS — G309 Alzheimer's disease, unspecified: Secondary | ICD-10-CM | POA: Diagnosis not present

## 2017-04-28 DIAGNOSIS — J189 Pneumonia, unspecified organism: Secondary | ICD-10-CM | POA: Diagnosis not present

## 2017-04-28 DIAGNOSIS — M150 Primary generalized (osteo)arthritis: Secondary | ICD-10-CM | POA: Diagnosis not present

## 2017-04-28 DIAGNOSIS — L8990 Pressure ulcer of unspecified site, unspecified stage: Secondary | ICD-10-CM | POA: Diagnosis not present

## 2017-05-09 DIAGNOSIS — S70321A Blister (nonthermal), right thigh, initial encounter: Secondary | ICD-10-CM | POA: Diagnosis not present

## 2017-05-09 DIAGNOSIS — X58XXXA Exposure to other specified factors, initial encounter: Secondary | ICD-10-CM | POA: Diagnosis not present

## 2017-05-09 DIAGNOSIS — R69 Illness, unspecified: Secondary | ICD-10-CM | POA: Diagnosis not present

## 2017-05-09 DIAGNOSIS — S70322A Blister (nonthermal), left thigh, initial encounter: Secondary | ICD-10-CM | POA: Diagnosis not present

## 2017-05-09 DIAGNOSIS — Z79899 Other long term (current) drug therapy: Secondary | ICD-10-CM | POA: Diagnosis not present

## 2017-05-09 DIAGNOSIS — Z881 Allergy status to other antibiotic agents status: Secondary | ICD-10-CM | POA: Diagnosis not present

## 2017-05-09 DIAGNOSIS — I10 Essential (primary) hypertension: Secondary | ICD-10-CM | POA: Diagnosis not present

## 2017-05-09 DIAGNOSIS — Z885 Allergy status to narcotic agent status: Secondary | ICD-10-CM | POA: Diagnosis not present

## 2017-05-28 DIAGNOSIS — J441 Chronic obstructive pulmonary disease with (acute) exacerbation: Secondary | ICD-10-CM | POA: Diagnosis not present

## 2017-05-28 DIAGNOSIS — J189 Pneumonia, unspecified organism: Secondary | ICD-10-CM | POA: Diagnosis not present

## 2017-05-28 DIAGNOSIS — I270 Primary pulmonary hypertension: Secondary | ICD-10-CM | POA: Diagnosis not present

## 2017-05-28 DIAGNOSIS — M150 Primary generalized (osteo)arthritis: Secondary | ICD-10-CM | POA: Diagnosis not present

## 2017-05-28 DIAGNOSIS — L8990 Pressure ulcer of unspecified site, unspecified stage: Secondary | ICD-10-CM | POA: Diagnosis not present

## 2017-05-28 DIAGNOSIS — G309 Alzheimer's disease, unspecified: Secondary | ICD-10-CM | POA: Diagnosis not present

## 2017-05-31 DIAGNOSIS — R69 Illness, unspecified: Secondary | ICD-10-CM | POA: Diagnosis not present

## 2017-06-05 DIAGNOSIS — G309 Alzheimer's disease, unspecified: Secondary | ICD-10-CM | POA: Diagnosis not present

## 2017-06-05 DIAGNOSIS — R69 Illness, unspecified: Secondary | ICD-10-CM | POA: Diagnosis not present

## 2017-06-05 DIAGNOSIS — R269 Unspecified abnormalities of gait and mobility: Secondary | ICD-10-CM | POA: Diagnosis not present

## 2017-06-05 DIAGNOSIS — I10 Essential (primary) hypertension: Secondary | ICD-10-CM | POA: Diagnosis not present

## 2017-06-05 DIAGNOSIS — Z789 Other specified health status: Secondary | ICD-10-CM | POA: Diagnosis not present

## 2017-06-05 DIAGNOSIS — Z299 Encounter for prophylactic measures, unspecified: Secondary | ICD-10-CM | POA: Diagnosis not present

## 2017-06-12 DIAGNOSIS — J189 Pneumonia, unspecified organism: Secondary | ICD-10-CM | POA: Diagnosis not present

## 2017-06-12 DIAGNOSIS — M150 Primary generalized (osteo)arthritis: Secondary | ICD-10-CM | POA: Diagnosis not present

## 2017-06-12 DIAGNOSIS — L8990 Pressure ulcer of unspecified site, unspecified stage: Secondary | ICD-10-CM | POA: Diagnosis not present

## 2017-06-12 DIAGNOSIS — I270 Primary pulmonary hypertension: Secondary | ICD-10-CM | POA: Diagnosis not present

## 2017-06-12 DIAGNOSIS — M539 Dorsopathy, unspecified: Secondary | ICD-10-CM | POA: Diagnosis not present

## 2017-06-12 DIAGNOSIS — G309 Alzheimer's disease, unspecified: Secondary | ICD-10-CM | POA: Diagnosis not present

## 2017-06-12 DIAGNOSIS — J441 Chronic obstructive pulmonary disease with (acute) exacerbation: Secondary | ICD-10-CM | POA: Diagnosis not present

## 2017-06-19 DIAGNOSIS — L309 Dermatitis, unspecified: Secondary | ICD-10-CM | POA: Diagnosis not present

## 2017-06-19 DIAGNOSIS — L739 Follicular disorder, unspecified: Secondary | ICD-10-CM | POA: Diagnosis not present

## 2017-06-28 DIAGNOSIS — J441 Chronic obstructive pulmonary disease with (acute) exacerbation: Secondary | ICD-10-CM | POA: Diagnosis not present

## 2017-06-28 DIAGNOSIS — L8990 Pressure ulcer of unspecified site, unspecified stage: Secondary | ICD-10-CM | POA: Diagnosis not present

## 2017-06-28 DIAGNOSIS — M150 Primary generalized (osteo)arthritis: Secondary | ICD-10-CM | POA: Diagnosis not present

## 2017-06-28 DIAGNOSIS — I270 Primary pulmonary hypertension: Secondary | ICD-10-CM | POA: Diagnosis not present

## 2017-06-28 DIAGNOSIS — G309 Alzheimer's disease, unspecified: Secondary | ICD-10-CM | POA: Diagnosis not present

## 2017-06-28 DIAGNOSIS — J189 Pneumonia, unspecified organism: Secondary | ICD-10-CM | POA: Diagnosis not present

## 2017-07-28 DIAGNOSIS — I270 Primary pulmonary hypertension: Secondary | ICD-10-CM | POA: Diagnosis not present

## 2017-07-28 DIAGNOSIS — M150 Primary generalized (osteo)arthritis: Secondary | ICD-10-CM | POA: Diagnosis not present

## 2017-07-28 DIAGNOSIS — J189 Pneumonia, unspecified organism: Secondary | ICD-10-CM | POA: Diagnosis not present

## 2017-07-28 DIAGNOSIS — G309 Alzheimer's disease, unspecified: Secondary | ICD-10-CM | POA: Diagnosis not present

## 2017-07-28 DIAGNOSIS — L8990 Pressure ulcer of unspecified site, unspecified stage: Secondary | ICD-10-CM | POA: Diagnosis not present

## 2017-07-28 DIAGNOSIS — J441 Chronic obstructive pulmonary disease with (acute) exacerbation: Secondary | ICD-10-CM | POA: Diagnosis not present

## 2017-08-28 DIAGNOSIS — L8990 Pressure ulcer of unspecified site, unspecified stage: Secondary | ICD-10-CM | POA: Diagnosis not present

## 2017-08-28 DIAGNOSIS — I270 Primary pulmonary hypertension: Secondary | ICD-10-CM | POA: Diagnosis not present

## 2017-08-28 DIAGNOSIS — J189 Pneumonia, unspecified organism: Secondary | ICD-10-CM | POA: Diagnosis not present

## 2017-08-28 DIAGNOSIS — J441 Chronic obstructive pulmonary disease with (acute) exacerbation: Secondary | ICD-10-CM | POA: Diagnosis not present

## 2017-08-28 DIAGNOSIS — M150 Primary generalized (osteo)arthritis: Secondary | ICD-10-CM | POA: Diagnosis not present

## 2017-08-28 DIAGNOSIS — G309 Alzheimer's disease, unspecified: Secondary | ICD-10-CM | POA: Diagnosis not present

## 2017-09-28 DIAGNOSIS — J441 Chronic obstructive pulmonary disease with (acute) exacerbation: Secondary | ICD-10-CM | POA: Diagnosis not present

## 2017-09-28 DIAGNOSIS — J189 Pneumonia, unspecified organism: Secondary | ICD-10-CM | POA: Diagnosis not present

## 2017-09-28 DIAGNOSIS — I270 Primary pulmonary hypertension: Secondary | ICD-10-CM | POA: Diagnosis not present

## 2017-09-28 DIAGNOSIS — M150 Primary generalized (osteo)arthritis: Secondary | ICD-10-CM | POA: Diagnosis not present

## 2017-09-28 DIAGNOSIS — G309 Alzheimer's disease, unspecified: Secondary | ICD-10-CM | POA: Diagnosis not present

## 2017-09-28 DIAGNOSIS — L8990 Pressure ulcer of unspecified site, unspecified stage: Secondary | ICD-10-CM | POA: Diagnosis not present

## 2017-10-26 DIAGNOSIS — G309 Alzheimer's disease, unspecified: Secondary | ICD-10-CM | POA: Diagnosis not present

## 2017-10-26 DIAGNOSIS — M150 Primary generalized (osteo)arthritis: Secondary | ICD-10-CM | POA: Diagnosis not present

## 2017-10-26 DIAGNOSIS — L8990 Pressure ulcer of unspecified site, unspecified stage: Secondary | ICD-10-CM | POA: Diagnosis not present

## 2017-10-26 DIAGNOSIS — I270 Primary pulmonary hypertension: Secondary | ICD-10-CM | POA: Diagnosis not present

## 2017-10-26 DIAGNOSIS — J189 Pneumonia, unspecified organism: Secondary | ICD-10-CM | POA: Diagnosis not present

## 2017-10-26 DIAGNOSIS — J441 Chronic obstructive pulmonary disease with (acute) exacerbation: Secondary | ICD-10-CM | POA: Diagnosis not present

## 2017-11-04 DIAGNOSIS — I272 Pulmonary hypertension, unspecified: Secondary | ICD-10-CM | POA: Diagnosis not present

## 2017-11-04 DIAGNOSIS — E7800 Pure hypercholesterolemia, unspecified: Secondary | ICD-10-CM | POA: Diagnosis not present

## 2017-11-04 DIAGNOSIS — Z6828 Body mass index (BMI) 28.0-28.9, adult: Secondary | ICD-10-CM | POA: Diagnosis not present

## 2017-11-04 DIAGNOSIS — R69 Illness, unspecified: Secondary | ICD-10-CM | POA: Diagnosis not present

## 2017-11-04 DIAGNOSIS — Z299 Encounter for prophylactic measures, unspecified: Secondary | ICD-10-CM | POA: Diagnosis not present

## 2017-11-04 DIAGNOSIS — I10 Essential (primary) hypertension: Secondary | ICD-10-CM | POA: Diagnosis not present

## 2017-11-04 DIAGNOSIS — G309 Alzheimer's disease, unspecified: Secondary | ICD-10-CM | POA: Diagnosis not present

## 2017-11-04 DIAGNOSIS — M79674 Pain in right toe(s): Secondary | ICD-10-CM | POA: Diagnosis not present

## 2017-11-17 DIAGNOSIS — J069 Acute upper respiratory infection, unspecified: Secondary | ICD-10-CM | POA: Diagnosis not present

## 2017-11-17 DIAGNOSIS — Z79899 Other long term (current) drug therapy: Secondary | ICD-10-CM | POA: Diagnosis not present

## 2017-11-17 DIAGNOSIS — R0602 Shortness of breath: Secondary | ICD-10-CM | POA: Diagnosis not present

## 2017-11-17 DIAGNOSIS — E7800 Pure hypercholesterolemia, unspecified: Secondary | ICD-10-CM | POA: Diagnosis not present

## 2017-11-17 DIAGNOSIS — M810 Age-related osteoporosis without current pathological fracture: Secondary | ICD-10-CM | POA: Diagnosis not present

## 2017-11-17 DIAGNOSIS — R69 Illness, unspecified: Secondary | ICD-10-CM | POA: Diagnosis not present

## 2017-11-17 DIAGNOSIS — R05 Cough: Secondary | ICD-10-CM | POA: Diagnosis not present

## 2017-11-17 DIAGNOSIS — M1990 Unspecified osteoarthritis, unspecified site: Secondary | ICD-10-CM | POA: Diagnosis not present

## 2017-11-17 DIAGNOSIS — I10 Essential (primary) hypertension: Secondary | ICD-10-CM | POA: Diagnosis not present

## 2017-11-17 DIAGNOSIS — Z950 Presence of cardiac pacemaker: Secondary | ICD-10-CM | POA: Diagnosis not present

## 2017-11-17 DIAGNOSIS — Z96651 Presence of right artificial knee joint: Secondary | ICD-10-CM | POA: Diagnosis not present

## 2017-11-18 DIAGNOSIS — R05 Cough: Secondary | ICD-10-CM | POA: Diagnosis not present

## 2017-11-19 DIAGNOSIS — K626 Ulcer of anus and rectum: Secondary | ICD-10-CM | POA: Diagnosis not present

## 2017-11-19 DIAGNOSIS — I10 Essential (primary) hypertension: Secondary | ICD-10-CM | POA: Diagnosis not present

## 2017-11-19 DIAGNOSIS — Z713 Dietary counseling and surveillance: Secondary | ICD-10-CM | POA: Diagnosis not present

## 2017-11-19 DIAGNOSIS — J069 Acute upper respiratory infection, unspecified: Secondary | ICD-10-CM | POA: Diagnosis not present

## 2017-11-19 DIAGNOSIS — Z789 Other specified health status: Secondary | ICD-10-CM | POA: Diagnosis not present

## 2017-11-19 DIAGNOSIS — Z299 Encounter for prophylactic measures, unspecified: Secondary | ICD-10-CM | POA: Diagnosis not present

## 2017-11-19 DIAGNOSIS — Z6831 Body mass index (BMI) 31.0-31.9, adult: Secondary | ICD-10-CM | POA: Diagnosis not present

## 2017-11-26 DIAGNOSIS — J441 Chronic obstructive pulmonary disease with (acute) exacerbation: Secondary | ICD-10-CM | POA: Diagnosis not present

## 2017-11-26 DIAGNOSIS — J189 Pneumonia, unspecified organism: Secondary | ICD-10-CM | POA: Diagnosis not present

## 2017-11-26 DIAGNOSIS — I270 Primary pulmonary hypertension: Secondary | ICD-10-CM | POA: Diagnosis not present

## 2017-11-26 DIAGNOSIS — L8990 Pressure ulcer of unspecified site, unspecified stage: Secondary | ICD-10-CM | POA: Diagnosis not present

## 2017-11-26 DIAGNOSIS — M150 Primary generalized (osteo)arthritis: Secondary | ICD-10-CM | POA: Diagnosis not present

## 2017-11-26 DIAGNOSIS — G309 Alzheimer's disease, unspecified: Secondary | ICD-10-CM | POA: Diagnosis not present

## 2017-12-19 DIAGNOSIS — I10 Essential (primary) hypertension: Secondary | ICD-10-CM | POA: Diagnosis not present

## 2017-12-19 DIAGNOSIS — G309 Alzheimer's disease, unspecified: Secondary | ICD-10-CM | POA: Diagnosis not present

## 2017-12-19 DIAGNOSIS — Z789 Other specified health status: Secondary | ICD-10-CM | POA: Diagnosis not present

## 2017-12-19 DIAGNOSIS — Z299 Encounter for prophylactic measures, unspecified: Secondary | ICD-10-CM | POA: Diagnosis not present

## 2017-12-19 DIAGNOSIS — B356 Tinea cruris: Secondary | ICD-10-CM | POA: Diagnosis not present

## 2017-12-19 DIAGNOSIS — J069 Acute upper respiratory infection, unspecified: Secondary | ICD-10-CM | POA: Diagnosis not present

## 2017-12-19 DIAGNOSIS — R69 Illness, unspecified: Secondary | ICD-10-CM | POA: Diagnosis not present

## 2017-12-19 DIAGNOSIS — Z6831 Body mass index (BMI) 31.0-31.9, adult: Secondary | ICD-10-CM | POA: Diagnosis not present

## 2017-12-26 DIAGNOSIS — J441 Chronic obstructive pulmonary disease with (acute) exacerbation: Secondary | ICD-10-CM | POA: Diagnosis not present

## 2017-12-26 DIAGNOSIS — M150 Primary generalized (osteo)arthritis: Secondary | ICD-10-CM | POA: Diagnosis not present

## 2017-12-26 DIAGNOSIS — J189 Pneumonia, unspecified organism: Secondary | ICD-10-CM | POA: Diagnosis not present

## 2017-12-26 DIAGNOSIS — I270 Primary pulmonary hypertension: Secondary | ICD-10-CM | POA: Diagnosis not present

## 2017-12-26 DIAGNOSIS — L8990 Pressure ulcer of unspecified site, unspecified stage: Secondary | ICD-10-CM | POA: Diagnosis not present

## 2017-12-26 DIAGNOSIS — G309 Alzheimer's disease, unspecified: Secondary | ICD-10-CM | POA: Diagnosis not present

## 2018-01-06 DIAGNOSIS — M1990 Unspecified osteoarthritis, unspecified site: Secondary | ICD-10-CM | POA: Diagnosis not present

## 2018-01-06 DIAGNOSIS — R05 Cough: Secondary | ICD-10-CM | POA: Diagnosis not present

## 2018-01-06 DIAGNOSIS — T17900A Unspecified foreign body in respiratory tract, part unspecified causing asphyxiation, initial encounter: Secondary | ICD-10-CM | POA: Diagnosis not present

## 2018-01-06 DIAGNOSIS — I10 Essential (primary) hypertension: Secondary | ICD-10-CM | POA: Diagnosis not present

## 2018-01-06 DIAGNOSIS — R69 Illness, unspecified: Secondary | ICD-10-CM | POA: Diagnosis not present

## 2018-01-06 DIAGNOSIS — G629 Polyneuropathy, unspecified: Secondary | ICD-10-CM | POA: Diagnosis not present

## 2018-01-06 DIAGNOSIS — X58XXXA Exposure to other specified factors, initial encounter: Secondary | ICD-10-CM | POA: Diagnosis not present

## 2018-01-06 DIAGNOSIS — T17908A Unspecified foreign body in respiratory tract, part unspecified causing other injury, initial encounter: Secondary | ICD-10-CM | POA: Diagnosis not present

## 2018-01-06 DIAGNOSIS — E785 Hyperlipidemia, unspecified: Secondary | ICD-10-CM | POA: Diagnosis not present

## 2018-01-06 DIAGNOSIS — R0602 Shortness of breath: Secondary | ICD-10-CM | POA: Diagnosis not present

## 2018-01-06 DIAGNOSIS — Z950 Presence of cardiac pacemaker: Secondary | ICD-10-CM | POA: Diagnosis not present

## 2018-01-07 DIAGNOSIS — J300 Vasomotor rhinitis: Secondary | ICD-10-CM | POA: Diagnosis not present

## 2018-01-07 DIAGNOSIS — J690 Pneumonitis due to inhalation of food and vomit: Secondary | ICD-10-CM | POA: Diagnosis not present

## 2018-01-07 DIAGNOSIS — R69 Illness, unspecified: Secondary | ICD-10-CM | POA: Diagnosis not present

## 2018-01-07 DIAGNOSIS — G309 Alzheimer's disease, unspecified: Secondary | ICD-10-CM | POA: Diagnosis not present

## 2018-01-26 DIAGNOSIS — G309 Alzheimer's disease, unspecified: Secondary | ICD-10-CM | POA: Diagnosis not present

## 2018-01-26 DIAGNOSIS — J189 Pneumonia, unspecified organism: Secondary | ICD-10-CM | POA: Diagnosis not present

## 2018-01-26 DIAGNOSIS — M150 Primary generalized (osteo)arthritis: Secondary | ICD-10-CM | POA: Diagnosis not present

## 2018-01-26 DIAGNOSIS — I270 Primary pulmonary hypertension: Secondary | ICD-10-CM | POA: Diagnosis not present

## 2018-01-26 DIAGNOSIS — L8990 Pressure ulcer of unspecified site, unspecified stage: Secondary | ICD-10-CM | POA: Diagnosis not present

## 2018-01-26 DIAGNOSIS — J441 Chronic obstructive pulmonary disease with (acute) exacerbation: Secondary | ICD-10-CM | POA: Diagnosis not present

## 2018-01-30 DIAGNOSIS — Z4800 Encounter for change or removal of nonsurgical wound dressing: Secondary | ICD-10-CM | POA: Diagnosis not present

## 2018-01-30 DIAGNOSIS — M545 Low back pain: Secondary | ICD-10-CM | POA: Diagnosis not present

## 2018-01-30 DIAGNOSIS — L89153 Pressure ulcer of sacral region, stage 3: Secondary | ICD-10-CM | POA: Diagnosis not present

## 2018-01-30 DIAGNOSIS — M519 Unspecified thoracic, thoracolumbar and lumbosacral intervertebral disc disorder: Secondary | ICD-10-CM | POA: Diagnosis not present

## 2018-01-30 DIAGNOSIS — G309 Alzheimer's disease, unspecified: Secondary | ICD-10-CM | POA: Diagnosis not present

## 2018-01-30 DIAGNOSIS — J45909 Unspecified asthma, uncomplicated: Secondary | ICD-10-CM | POA: Diagnosis not present

## 2018-01-30 DIAGNOSIS — R69 Illness, unspecified: Secondary | ICD-10-CM | POA: Diagnosis not present

## 2018-02-03 DIAGNOSIS — I270 Primary pulmonary hypertension: Secondary | ICD-10-CM | POA: Diagnosis not present

## 2018-02-03 DIAGNOSIS — J189 Pneumonia, unspecified organism: Secondary | ICD-10-CM | POA: Diagnosis not present

## 2018-02-03 DIAGNOSIS — J441 Chronic obstructive pulmonary disease with (acute) exacerbation: Secondary | ICD-10-CM | POA: Diagnosis not present

## 2018-02-03 DIAGNOSIS — L8990 Pressure ulcer of unspecified site, unspecified stage: Secondary | ICD-10-CM | POA: Diagnosis not present

## 2018-02-03 DIAGNOSIS — M150 Primary generalized (osteo)arthritis: Secondary | ICD-10-CM | POA: Diagnosis not present

## 2018-02-03 DIAGNOSIS — M539 Dorsopathy, unspecified: Secondary | ICD-10-CM | POA: Diagnosis not present

## 2018-02-03 DIAGNOSIS — G309 Alzheimer's disease, unspecified: Secondary | ICD-10-CM | POA: Diagnosis not present

## 2018-02-05 DIAGNOSIS — J441 Chronic obstructive pulmonary disease with (acute) exacerbation: Secondary | ICD-10-CM | POA: Diagnosis not present

## 2018-02-05 DIAGNOSIS — M150 Primary generalized (osteo)arthritis: Secondary | ICD-10-CM | POA: Diagnosis not present

## 2018-02-05 DIAGNOSIS — M539 Dorsopathy, unspecified: Secondary | ICD-10-CM | POA: Diagnosis not present

## 2018-02-05 DIAGNOSIS — G309 Alzheimer's disease, unspecified: Secondary | ICD-10-CM | POA: Diagnosis not present

## 2018-02-05 DIAGNOSIS — L8990 Pressure ulcer of unspecified site, unspecified stage: Secondary | ICD-10-CM | POA: Diagnosis not present

## 2018-02-05 DIAGNOSIS — L89153 Pressure ulcer of sacral region, stage 3: Secondary | ICD-10-CM | POA: Diagnosis not present

## 2018-02-05 DIAGNOSIS — J189 Pneumonia, unspecified organism: Secondary | ICD-10-CM | POA: Diagnosis not present

## 2018-02-05 DIAGNOSIS — I270 Primary pulmonary hypertension: Secondary | ICD-10-CM | POA: Diagnosis not present

## 2018-02-17 DIAGNOSIS — I2510 Atherosclerotic heart disease of native coronary artery without angina pectoris: Secondary | ICD-10-CM | POA: Diagnosis not present

## 2018-02-17 DIAGNOSIS — I10 Essential (primary) hypertension: Secondary | ICD-10-CM | POA: Diagnosis not present

## 2018-02-17 DIAGNOSIS — Z950 Presence of cardiac pacemaker: Secondary | ICD-10-CM | POA: Diagnosis not present

## 2018-02-17 DIAGNOSIS — E785 Hyperlipidemia, unspecified: Secondary | ICD-10-CM | POA: Diagnosis not present

## 2018-02-17 DIAGNOSIS — I495 Sick sinus syndrome: Secondary | ICD-10-CM | POA: Diagnosis not present

## 2018-02-25 DIAGNOSIS — J189 Pneumonia, unspecified organism: Secondary | ICD-10-CM | POA: Diagnosis not present

## 2018-02-25 DIAGNOSIS — J441 Chronic obstructive pulmonary disease with (acute) exacerbation: Secondary | ICD-10-CM | POA: Diagnosis not present

## 2018-02-25 DIAGNOSIS — Z1331 Encounter for screening for depression: Secondary | ICD-10-CM | POA: Diagnosis not present

## 2018-02-25 DIAGNOSIS — Z7189 Other specified counseling: Secondary | ICD-10-CM | POA: Diagnosis not present

## 2018-02-25 DIAGNOSIS — Z1339 Encounter for screening examination for other mental health and behavioral disorders: Secondary | ICD-10-CM | POA: Diagnosis not present

## 2018-02-25 DIAGNOSIS — G309 Alzheimer's disease, unspecified: Secondary | ICD-10-CM | POA: Diagnosis not present

## 2018-02-25 DIAGNOSIS — I10 Essential (primary) hypertension: Secondary | ICD-10-CM | POA: Diagnosis not present

## 2018-02-25 DIAGNOSIS — R5383 Other fatigue: Secondary | ICD-10-CM | POA: Diagnosis not present

## 2018-02-25 DIAGNOSIS — Z79899 Other long term (current) drug therapy: Secondary | ICD-10-CM | POA: Diagnosis not present

## 2018-02-25 DIAGNOSIS — M150 Primary generalized (osteo)arthritis: Secondary | ICD-10-CM | POA: Diagnosis not present

## 2018-02-25 DIAGNOSIS — Z0000 Encounter for general adult medical examination without abnormal findings: Secondary | ICD-10-CM | POA: Diagnosis not present

## 2018-02-25 DIAGNOSIS — L8990 Pressure ulcer of unspecified site, unspecified stage: Secondary | ICD-10-CM | POA: Diagnosis not present

## 2018-02-25 DIAGNOSIS — Z6828 Body mass index (BMI) 28.0-28.9, adult: Secondary | ICD-10-CM | POA: Diagnosis not present

## 2018-02-25 DIAGNOSIS — E7800 Pure hypercholesterolemia, unspecified: Secondary | ICD-10-CM | POA: Diagnosis not present

## 2018-02-25 DIAGNOSIS — Z299 Encounter for prophylactic measures, unspecified: Secondary | ICD-10-CM | POA: Diagnosis not present

## 2018-02-25 DIAGNOSIS — I270 Primary pulmonary hypertension: Secondary | ICD-10-CM | POA: Diagnosis not present

## 2018-03-07 DIAGNOSIS — L89153 Pressure ulcer of sacral region, stage 3: Secondary | ICD-10-CM | POA: Diagnosis not present

## 2018-03-07 DIAGNOSIS — J189 Pneumonia, unspecified organism: Secondary | ICD-10-CM | POA: Diagnosis not present

## 2018-03-07 DIAGNOSIS — J441 Chronic obstructive pulmonary disease with (acute) exacerbation: Secondary | ICD-10-CM | POA: Diagnosis not present

## 2018-03-07 DIAGNOSIS — M150 Primary generalized (osteo)arthritis: Secondary | ICD-10-CM | POA: Diagnosis not present

## 2018-03-07 DIAGNOSIS — L8990 Pressure ulcer of unspecified site, unspecified stage: Secondary | ICD-10-CM | POA: Diagnosis not present

## 2018-03-07 DIAGNOSIS — M539 Dorsopathy, unspecified: Secondary | ICD-10-CM | POA: Diagnosis not present

## 2018-03-07 DIAGNOSIS — G309 Alzheimer's disease, unspecified: Secondary | ICD-10-CM | POA: Diagnosis not present

## 2018-03-07 DIAGNOSIS — I270 Primary pulmonary hypertension: Secondary | ICD-10-CM | POA: Diagnosis not present

## 2018-03-28 DIAGNOSIS — G309 Alzheimer's disease, unspecified: Secondary | ICD-10-CM | POA: Diagnosis not present

## 2018-03-28 DIAGNOSIS — L8990 Pressure ulcer of unspecified site, unspecified stage: Secondary | ICD-10-CM | POA: Diagnosis not present

## 2018-03-28 DIAGNOSIS — I270 Primary pulmonary hypertension: Secondary | ICD-10-CM | POA: Diagnosis not present

## 2018-03-28 DIAGNOSIS — J441 Chronic obstructive pulmonary disease with (acute) exacerbation: Secondary | ICD-10-CM | POA: Diagnosis not present

## 2018-03-28 DIAGNOSIS — M150 Primary generalized (osteo)arthritis: Secondary | ICD-10-CM | POA: Diagnosis not present

## 2018-03-28 DIAGNOSIS — J189 Pneumonia, unspecified organism: Secondary | ICD-10-CM | POA: Diagnosis not present

## 2018-04-16 ENCOUNTER — Encounter: Payer: Self-pay | Admitting: Internal Medicine

## 2018-04-22 DIAGNOSIS — B351 Tinea unguium: Secondary | ICD-10-CM | POA: Diagnosis not present

## 2018-04-22 DIAGNOSIS — I739 Peripheral vascular disease, unspecified: Secondary | ICD-10-CM | POA: Diagnosis not present

## 2018-04-28 DIAGNOSIS — G309 Alzheimer's disease, unspecified: Secondary | ICD-10-CM | POA: Diagnosis not present

## 2018-04-28 DIAGNOSIS — M150 Primary generalized (osteo)arthritis: Secondary | ICD-10-CM | POA: Diagnosis not present

## 2018-04-28 DIAGNOSIS — J441 Chronic obstructive pulmonary disease with (acute) exacerbation: Secondary | ICD-10-CM | POA: Diagnosis not present

## 2018-04-28 DIAGNOSIS — L8990 Pressure ulcer of unspecified site, unspecified stage: Secondary | ICD-10-CM | POA: Diagnosis not present

## 2018-04-28 DIAGNOSIS — I270 Primary pulmonary hypertension: Secondary | ICD-10-CM | POA: Diagnosis not present

## 2018-04-28 DIAGNOSIS — J189 Pneumonia, unspecified organism: Secondary | ICD-10-CM | POA: Diagnosis not present

## 2018-05-16 DIAGNOSIS — R69 Illness, unspecified: Secondary | ICD-10-CM | POA: Diagnosis not present

## 2018-05-19 DIAGNOSIS — R69 Illness, unspecified: Secondary | ICD-10-CM | POA: Diagnosis not present

## 2018-05-19 DIAGNOSIS — L309 Dermatitis, unspecified: Secondary | ICD-10-CM | POA: Diagnosis not present

## 2018-05-19 DIAGNOSIS — I10 Essential (primary) hypertension: Secondary | ICD-10-CM | POA: Diagnosis not present

## 2018-05-19 DIAGNOSIS — M545 Low back pain: Secondary | ICD-10-CM | POA: Diagnosis not present

## 2018-05-19 DIAGNOSIS — Z6838 Body mass index (BMI) 38.0-38.9, adult: Secondary | ICD-10-CM | POA: Diagnosis not present

## 2018-05-19 DIAGNOSIS — G309 Alzheimer's disease, unspecified: Secondary | ICD-10-CM | POA: Diagnosis not present

## 2018-05-19 DIAGNOSIS — Z299 Encounter for prophylactic measures, unspecified: Secondary | ICD-10-CM | POA: Diagnosis not present

## 2018-05-28 DIAGNOSIS — G309 Alzheimer's disease, unspecified: Secondary | ICD-10-CM | POA: Diagnosis not present

## 2018-05-28 DIAGNOSIS — I270 Primary pulmonary hypertension: Secondary | ICD-10-CM | POA: Diagnosis not present

## 2018-05-28 DIAGNOSIS — L8990 Pressure ulcer of unspecified site, unspecified stage: Secondary | ICD-10-CM | POA: Diagnosis not present

## 2018-05-28 DIAGNOSIS — J441 Chronic obstructive pulmonary disease with (acute) exacerbation: Secondary | ICD-10-CM | POA: Diagnosis not present

## 2018-05-28 DIAGNOSIS — J189 Pneumonia, unspecified organism: Secondary | ICD-10-CM | POA: Diagnosis not present

## 2018-05-28 DIAGNOSIS — M150 Primary generalized (osteo)arthritis: Secondary | ICD-10-CM | POA: Diagnosis not present

## 2018-06-05 DIAGNOSIS — I10 Essential (primary) hypertension: Secondary | ICD-10-CM | POA: Diagnosis not present

## 2018-06-05 DIAGNOSIS — R69 Illness, unspecified: Secondary | ICD-10-CM | POA: Diagnosis not present

## 2018-06-05 DIAGNOSIS — Z299 Encounter for prophylactic measures, unspecified: Secondary | ICD-10-CM | POA: Diagnosis not present

## 2018-06-05 DIAGNOSIS — Z6828 Body mass index (BMI) 28.0-28.9, adult: Secondary | ICD-10-CM | POA: Diagnosis not present

## 2018-06-05 DIAGNOSIS — G309 Alzheimer's disease, unspecified: Secondary | ICD-10-CM | POA: Diagnosis not present

## 2018-06-05 DIAGNOSIS — R21 Rash and other nonspecific skin eruption: Secondary | ICD-10-CM | POA: Diagnosis not present

## 2018-06-05 DIAGNOSIS — N390 Urinary tract infection, site not specified: Secondary | ICD-10-CM | POA: Diagnosis not present

## 2018-06-28 DIAGNOSIS — L8990 Pressure ulcer of unspecified site, unspecified stage: Secondary | ICD-10-CM | POA: Diagnosis not present

## 2018-06-28 DIAGNOSIS — I270 Primary pulmonary hypertension: Secondary | ICD-10-CM | POA: Diagnosis not present

## 2018-06-28 DIAGNOSIS — J441 Chronic obstructive pulmonary disease with (acute) exacerbation: Secondary | ICD-10-CM | POA: Diagnosis not present

## 2018-06-28 DIAGNOSIS — G309 Alzheimer's disease, unspecified: Secondary | ICD-10-CM | POA: Diagnosis not present

## 2018-06-28 DIAGNOSIS — J189 Pneumonia, unspecified organism: Secondary | ICD-10-CM | POA: Diagnosis not present

## 2018-06-28 DIAGNOSIS — M150 Primary generalized (osteo)arthritis: Secondary | ICD-10-CM | POA: Diagnosis not present

## 2018-06-30 DIAGNOSIS — Z66 Do not resuscitate: Secondary | ICD-10-CM | POA: Diagnosis not present

## 2018-06-30 DIAGNOSIS — L89151 Pressure ulcer of sacral region, stage 1: Secondary | ICD-10-CM | POA: Diagnosis not present

## 2018-06-30 DIAGNOSIS — R7989 Other specified abnormal findings of blood chemistry: Secondary | ICD-10-CM | POA: Diagnosis not present

## 2018-06-30 DIAGNOSIS — I10 Essential (primary) hypertension: Secondary | ICD-10-CM | POA: Diagnosis not present

## 2018-06-30 DIAGNOSIS — K5730 Diverticulosis of large intestine without perforation or abscess without bleeding: Secondary | ICD-10-CM | POA: Diagnosis not present

## 2018-06-30 DIAGNOSIS — R634 Abnormal weight loss: Secondary | ICD-10-CM | POA: Diagnosis not present

## 2018-06-30 DIAGNOSIS — R509 Fever, unspecified: Secondary | ICD-10-CM | POA: Diagnosis not present

## 2018-06-30 DIAGNOSIS — R05 Cough: Secondary | ICD-10-CM | POA: Diagnosis not present

## 2018-06-30 DIAGNOSIS — Z6826 Body mass index (BMI) 26.0-26.9, adult: Secondary | ICD-10-CM | POA: Diagnosis not present

## 2018-06-30 DIAGNOSIS — G309 Alzheimer's disease, unspecified: Secondary | ICD-10-CM | POA: Diagnosis not present

## 2018-06-30 DIAGNOSIS — R945 Abnormal results of liver function studies: Secondary | ICD-10-CM | POA: Diagnosis not present

## 2018-06-30 DIAGNOSIS — Z87440 Personal history of urinary (tract) infections: Secondary | ICD-10-CM | POA: Diagnosis not present

## 2018-06-30 DIAGNOSIS — L89159 Pressure ulcer of sacral region, unspecified stage: Secondary | ICD-10-CM | POA: Diagnosis not present

## 2018-06-30 DIAGNOSIS — N390 Urinary tract infection, site not specified: Secondary | ICD-10-CM | POA: Diagnosis not present

## 2018-06-30 DIAGNOSIS — R69 Illness, unspecified: Secondary | ICD-10-CM | POA: Diagnosis not present

## 2018-07-01 DIAGNOSIS — L89151 Pressure ulcer of sacral region, stage 1: Secondary | ICD-10-CM | POA: Diagnosis not present

## 2018-07-01 DIAGNOSIS — R945 Abnormal results of liver function studies: Secondary | ICD-10-CM | POA: Diagnosis not present

## 2018-07-01 DIAGNOSIS — G309 Alzheimer's disease, unspecified: Secondary | ICD-10-CM | POA: Diagnosis not present

## 2018-07-01 DIAGNOSIS — N390 Urinary tract infection, site not specified: Secondary | ICD-10-CM | POA: Diagnosis not present

## 2018-07-01 DIAGNOSIS — K5730 Diverticulosis of large intestine without perforation or abscess without bleeding: Secondary | ICD-10-CM | POA: Diagnosis not present

## 2018-07-01 DIAGNOSIS — R509 Fever, unspecified: Secondary | ICD-10-CM | POA: Diagnosis not present

## 2018-07-02 DIAGNOSIS — R509 Fever, unspecified: Secondary | ICD-10-CM | POA: Diagnosis not present

## 2018-07-02 DIAGNOSIS — G309 Alzheimer's disease, unspecified: Secondary | ICD-10-CM | POA: Diagnosis not present

## 2018-07-02 DIAGNOSIS — N390 Urinary tract infection, site not specified: Secondary | ICD-10-CM | POA: Diagnosis not present

## 2018-07-02 DIAGNOSIS — L89151 Pressure ulcer of sacral region, stage 1: Secondary | ICD-10-CM | POA: Diagnosis not present

## 2018-07-03 DIAGNOSIS — N390 Urinary tract infection, site not specified: Secondary | ICD-10-CM | POA: Diagnosis not present

## 2018-07-03 DIAGNOSIS — L89151 Pressure ulcer of sacral region, stage 1: Secondary | ICD-10-CM | POA: Diagnosis not present

## 2018-07-03 DIAGNOSIS — G309 Alzheimer's disease, unspecified: Secondary | ICD-10-CM | POA: Diagnosis not present

## 2018-07-03 DIAGNOSIS — L89159 Pressure ulcer of sacral region, unspecified stage: Secondary | ICD-10-CM | POA: Diagnosis not present

## 2018-07-03 DIAGNOSIS — R509 Fever, unspecified: Secondary | ICD-10-CM | POA: Diagnosis not present

## 2018-07-15 DIAGNOSIS — I10 Essential (primary) hypertension: Secondary | ICD-10-CM | POA: Diagnosis not present

## 2018-07-15 DIAGNOSIS — G309 Alzheimer's disease, unspecified: Secondary | ICD-10-CM | POA: Diagnosis not present

## 2018-07-15 DIAGNOSIS — Z6826 Body mass index (BMI) 26.0-26.9, adult: Secondary | ICD-10-CM | POA: Diagnosis not present

## 2018-07-15 DIAGNOSIS — R69 Illness, unspecified: Secondary | ICD-10-CM | POA: Diagnosis not present

## 2018-07-15 DIAGNOSIS — Z299 Encounter for prophylactic measures, unspecified: Secondary | ICD-10-CM | POA: Diagnosis not present

## 2018-07-15 DIAGNOSIS — N390 Urinary tract infection, site not specified: Secondary | ICD-10-CM | POA: Diagnosis not present

## 2018-07-28 DIAGNOSIS — M150 Primary generalized (osteo)arthritis: Secondary | ICD-10-CM | POA: Diagnosis not present

## 2018-07-28 DIAGNOSIS — I270 Primary pulmonary hypertension: Secondary | ICD-10-CM | POA: Diagnosis not present

## 2018-07-28 DIAGNOSIS — L8990 Pressure ulcer of unspecified site, unspecified stage: Secondary | ICD-10-CM | POA: Diagnosis not present

## 2018-07-28 DIAGNOSIS — G309 Alzheimer's disease, unspecified: Secondary | ICD-10-CM | POA: Diagnosis not present

## 2018-07-28 DIAGNOSIS — J441 Chronic obstructive pulmonary disease with (acute) exacerbation: Secondary | ICD-10-CM | POA: Diagnosis not present

## 2018-07-28 DIAGNOSIS — J189 Pneumonia, unspecified organism: Secondary | ICD-10-CM | POA: Diagnosis not present

## 2018-07-29 DIAGNOSIS — S91301A Unspecified open wound, right foot, initial encounter: Secondary | ICD-10-CM | POA: Diagnosis not present

## 2018-07-29 DIAGNOSIS — L89159 Pressure ulcer of sacral region, unspecified stage: Secondary | ICD-10-CM | POA: Diagnosis not present

## 2018-08-25 DIAGNOSIS — I10 Essential (primary) hypertension: Secondary | ICD-10-CM | POA: Diagnosis not present

## 2018-08-25 DIAGNOSIS — Z299 Encounter for prophylactic measures, unspecified: Secondary | ICD-10-CM | POA: Diagnosis not present

## 2018-08-25 DIAGNOSIS — R69 Illness, unspecified: Secondary | ICD-10-CM | POA: Diagnosis not present

## 2018-08-25 DIAGNOSIS — J069 Acute upper respiratory infection, unspecified: Secondary | ICD-10-CM | POA: Diagnosis not present

## 2018-08-25 DIAGNOSIS — W19XXXA Unspecified fall, initial encounter: Secondary | ICD-10-CM | POA: Diagnosis not present

## 2018-08-25 DIAGNOSIS — Z6826 Body mass index (BMI) 26.0-26.9, adult: Secondary | ICD-10-CM | POA: Diagnosis not present

## 2018-08-28 DIAGNOSIS — J189 Pneumonia, unspecified organism: Secondary | ICD-10-CM | POA: Diagnosis not present

## 2018-08-28 DIAGNOSIS — J441 Chronic obstructive pulmonary disease with (acute) exacerbation: Secondary | ICD-10-CM | POA: Diagnosis not present

## 2018-08-28 DIAGNOSIS — I270 Primary pulmonary hypertension: Secondary | ICD-10-CM | POA: Diagnosis not present

## 2018-08-28 DIAGNOSIS — M150 Primary generalized (osteo)arthritis: Secondary | ICD-10-CM | POA: Diagnosis not present

## 2018-08-28 DIAGNOSIS — G309 Alzheimer's disease, unspecified: Secondary | ICD-10-CM | POA: Diagnosis not present

## 2018-08-28 DIAGNOSIS — L8990 Pressure ulcer of unspecified site, unspecified stage: Secondary | ICD-10-CM | POA: Diagnosis not present

## 2018-09-18 DIAGNOSIS — L03032 Cellulitis of left toe: Secondary | ICD-10-CM | POA: Diagnosis not present

## 2018-09-18 DIAGNOSIS — M79672 Pain in left foot: Secondary | ICD-10-CM | POA: Diagnosis not present

## 2018-09-18 DIAGNOSIS — M79674 Pain in right toe(s): Secondary | ICD-10-CM | POA: Diagnosis not present

## 2018-09-28 DIAGNOSIS — J189 Pneumonia, unspecified organism: Secondary | ICD-10-CM | POA: Diagnosis not present

## 2018-09-28 DIAGNOSIS — M150 Primary generalized (osteo)arthritis: Secondary | ICD-10-CM | POA: Diagnosis not present

## 2018-09-28 DIAGNOSIS — L8990 Pressure ulcer of unspecified site, unspecified stage: Secondary | ICD-10-CM | POA: Diagnosis not present

## 2018-09-28 DIAGNOSIS — G309 Alzheimer's disease, unspecified: Secondary | ICD-10-CM | POA: Diagnosis not present

## 2018-09-28 DIAGNOSIS — J441 Chronic obstructive pulmonary disease with (acute) exacerbation: Secondary | ICD-10-CM | POA: Diagnosis not present

## 2018-09-28 DIAGNOSIS — I270 Primary pulmonary hypertension: Secondary | ICD-10-CM | POA: Diagnosis not present

## 2018-10-09 DIAGNOSIS — E1140 Type 2 diabetes mellitus with diabetic neuropathy, unspecified: Secondary | ICD-10-CM | POA: Diagnosis not present

## 2018-10-09 DIAGNOSIS — L89892 Pressure ulcer of other site, stage 2: Secondary | ICD-10-CM | POA: Diagnosis not present

## 2018-10-09 DIAGNOSIS — I739 Peripheral vascular disease, unspecified: Secondary | ICD-10-CM | POA: Diagnosis not present

## 2018-10-23 DIAGNOSIS — L89892 Pressure ulcer of other site, stage 2: Secondary | ICD-10-CM | POA: Diagnosis not present

## 2018-10-23 DIAGNOSIS — I739 Peripheral vascular disease, unspecified: Secondary | ICD-10-CM | POA: Diagnosis not present

## 2018-10-24 DIAGNOSIS — R69 Illness, unspecified: Secondary | ICD-10-CM | POA: Diagnosis not present

## 2018-10-24 DIAGNOSIS — L89309 Pressure ulcer of unspecified buttock, unspecified stage: Secondary | ICD-10-CM | POA: Diagnosis not present

## 2018-10-24 DIAGNOSIS — G309 Alzheimer's disease, unspecified: Secondary | ICD-10-CM | POA: Diagnosis not present

## 2018-10-24 DIAGNOSIS — Z299 Encounter for prophylactic measures, unspecified: Secondary | ICD-10-CM | POA: Diagnosis not present

## 2018-10-24 DIAGNOSIS — Z6826 Body mass index (BMI) 26.0-26.9, adult: Secondary | ICD-10-CM | POA: Diagnosis not present

## 2018-10-24 DIAGNOSIS — I10 Essential (primary) hypertension: Secondary | ICD-10-CM | POA: Diagnosis not present

## 2018-10-27 DIAGNOSIS — I270 Primary pulmonary hypertension: Secondary | ICD-10-CM | POA: Diagnosis not present

## 2018-10-27 DIAGNOSIS — J441 Chronic obstructive pulmonary disease with (acute) exacerbation: Secondary | ICD-10-CM | POA: Diagnosis not present

## 2018-10-27 DIAGNOSIS — J189 Pneumonia, unspecified organism: Secondary | ICD-10-CM | POA: Diagnosis not present

## 2018-10-27 DIAGNOSIS — M150 Primary generalized (osteo)arthritis: Secondary | ICD-10-CM | POA: Diagnosis not present

## 2018-11-13 DIAGNOSIS — M545 Low back pain: Secondary | ICD-10-CM | POA: Diagnosis not present

## 2018-11-13 DIAGNOSIS — L89621 Pressure ulcer of left heel, stage 1: Secondary | ICD-10-CM | POA: Diagnosis not present

## 2018-11-13 DIAGNOSIS — L89151 Pressure ulcer of sacral region, stage 1: Secondary | ICD-10-CM | POA: Diagnosis not present

## 2018-11-13 DIAGNOSIS — L89321 Pressure ulcer of left buttock, stage 1: Secondary | ICD-10-CM | POA: Diagnosis not present

## 2018-11-13 DIAGNOSIS — L89892 Pressure ulcer of other site, stage 2: Secondary | ICD-10-CM | POA: Diagnosis not present

## 2018-11-13 DIAGNOSIS — G309 Alzheimer's disease, unspecified: Secondary | ICD-10-CM | POA: Diagnosis not present

## 2018-11-13 DIAGNOSIS — R69 Illness, unspecified: Secondary | ICD-10-CM | POA: Diagnosis not present

## 2018-11-13 DIAGNOSIS — I10 Essential (primary) hypertension: Secondary | ICD-10-CM | POA: Diagnosis not present

## 2018-11-13 DIAGNOSIS — L309 Dermatitis, unspecified: Secondary | ICD-10-CM | POA: Diagnosis not present

## 2018-11-13 DIAGNOSIS — L89611 Pressure ulcer of right heel, stage 1: Secondary | ICD-10-CM | POA: Diagnosis not present

## 2018-11-20 DIAGNOSIS — Z993 Dependence on wheelchair: Secondary | ICD-10-CM | POA: Diagnosis not present

## 2018-11-20 DIAGNOSIS — B369 Superficial mycosis, unspecified: Secondary | ICD-10-CM | POA: Diagnosis not present

## 2018-11-20 DIAGNOSIS — Z299 Encounter for prophylactic measures, unspecified: Secondary | ICD-10-CM | POA: Diagnosis not present

## 2018-11-20 DIAGNOSIS — G309 Alzheimer's disease, unspecified: Secondary | ICD-10-CM | POA: Diagnosis not present

## 2018-11-20 DIAGNOSIS — Z79899 Other long term (current) drug therapy: Secondary | ICD-10-CM | POA: Diagnosis not present

## 2018-11-20 DIAGNOSIS — R69 Illness, unspecified: Secondary | ICD-10-CM | POA: Diagnosis not present

## 2018-11-27 DIAGNOSIS — M150 Primary generalized (osteo)arthritis: Secondary | ICD-10-CM | POA: Diagnosis not present

## 2018-11-27 DIAGNOSIS — J441 Chronic obstructive pulmonary disease with (acute) exacerbation: Secondary | ICD-10-CM | POA: Diagnosis not present

## 2018-11-27 DIAGNOSIS — J189 Pneumonia, unspecified organism: Secondary | ICD-10-CM | POA: Diagnosis not present

## 2018-11-27 DIAGNOSIS — I270 Primary pulmonary hypertension: Secondary | ICD-10-CM | POA: Diagnosis not present

## 2018-12-01 DIAGNOSIS — L89153 Pressure ulcer of sacral region, stage 3: Secondary | ICD-10-CM | POA: Diagnosis not present

## 2018-12-01 DIAGNOSIS — G309 Alzheimer's disease, unspecified: Secondary | ICD-10-CM | POA: Diagnosis not present

## 2018-12-01 DIAGNOSIS — J189 Pneumonia, unspecified organism: Secondary | ICD-10-CM | POA: Diagnosis not present

## 2018-12-01 DIAGNOSIS — M539 Dorsopathy, unspecified: Secondary | ICD-10-CM | POA: Diagnosis not present

## 2018-12-12 DIAGNOSIS — L89899 Pressure ulcer of other site, unspecified stage: Secondary | ICD-10-CM | POA: Diagnosis not present

## 2018-12-16 DIAGNOSIS — K626 Ulcer of anus and rectum: Secondary | ICD-10-CM | POA: Diagnosis not present

## 2018-12-18 DIAGNOSIS — K626 Ulcer of anus and rectum: Secondary | ICD-10-CM | POA: Diagnosis not present

## 2018-12-26 DIAGNOSIS — L89894 Pressure ulcer of other site, stage 4: Secondary | ICD-10-CM | POA: Diagnosis not present

## 2018-12-26 DIAGNOSIS — L89892 Pressure ulcer of other site, stage 2: Secondary | ICD-10-CM | POA: Diagnosis not present

## 2018-12-26 DIAGNOSIS — K626 Ulcer of anus and rectum: Secondary | ICD-10-CM | POA: Diagnosis not present

## 2018-12-27 DIAGNOSIS — M150 Primary generalized (osteo)arthritis: Secondary | ICD-10-CM | POA: Diagnosis not present

## 2018-12-27 DIAGNOSIS — I270 Primary pulmonary hypertension: Secondary | ICD-10-CM | POA: Diagnosis not present

## 2018-12-27 DIAGNOSIS — J441 Chronic obstructive pulmonary disease with (acute) exacerbation: Secondary | ICD-10-CM | POA: Diagnosis not present

## 2018-12-27 DIAGNOSIS — J189 Pneumonia, unspecified organism: Secondary | ICD-10-CM | POA: Diagnosis not present

## 2018-12-28 DIAGNOSIS — L89892 Pressure ulcer of other site, stage 2: Secondary | ICD-10-CM | POA: Diagnosis not present

## 2019-01-02 DIAGNOSIS — L89894 Pressure ulcer of other site, stage 4: Secondary | ICD-10-CM | POA: Diagnosis not present

## 2019-01-09 DIAGNOSIS — L89894 Pressure ulcer of other site, stage 4: Secondary | ICD-10-CM | POA: Diagnosis not present

## 2019-01-16 DIAGNOSIS — R69 Illness, unspecified: Secondary | ICD-10-CM | POA: Diagnosis not present

## 2019-01-16 DIAGNOSIS — L97519 Non-pressure chronic ulcer of other part of right foot with unspecified severity: Secondary | ICD-10-CM | POA: Diagnosis not present

## 2019-01-16 DIAGNOSIS — G4700 Insomnia, unspecified: Secondary | ICD-10-CM | POA: Diagnosis not present

## 2019-01-16 DIAGNOSIS — Z6826 Body mass index (BMI) 26.0-26.9, adult: Secondary | ICD-10-CM | POA: Diagnosis not present

## 2019-01-16 DIAGNOSIS — Z299 Encounter for prophylactic measures, unspecified: Secondary | ICD-10-CM | POA: Diagnosis not present

## 2019-01-16 DIAGNOSIS — G309 Alzheimer's disease, unspecified: Secondary | ICD-10-CM | POA: Diagnosis not present

## 2019-01-21 DIAGNOSIS — R69 Illness, unspecified: Secondary | ICD-10-CM | POA: Diagnosis not present

## 2019-01-21 DIAGNOSIS — L97504 Non-pressure chronic ulcer of other part of unspecified foot with necrosis of bone: Secondary | ICD-10-CM | POA: Diagnosis not present

## 2019-01-21 DIAGNOSIS — Z885 Allergy status to narcotic agent status: Secondary | ICD-10-CM | POA: Diagnosis not present

## 2019-01-21 DIAGNOSIS — Z881 Allergy status to other antibiotic agents status: Secondary | ICD-10-CM | POA: Diagnosis not present

## 2019-01-21 DIAGNOSIS — M810 Age-related osteoporosis without current pathological fracture: Secondary | ICD-10-CM | POA: Diagnosis not present

## 2019-01-21 DIAGNOSIS — G309 Alzheimer's disease, unspecified: Secondary | ICD-10-CM | POA: Diagnosis not present

## 2019-01-21 DIAGNOSIS — M86271 Subacute osteomyelitis, right ankle and foot: Secondary | ICD-10-CM | POA: Diagnosis not present

## 2019-01-21 DIAGNOSIS — Z299 Encounter for prophylactic measures, unspecified: Secondary | ICD-10-CM | POA: Diagnosis not present

## 2019-01-21 DIAGNOSIS — I10 Essential (primary) hypertension: Secondary | ICD-10-CM | POA: Diagnosis not present

## 2019-01-21 DIAGNOSIS — Z6826 Body mass index (BMI) 26.0-26.9, adult: Secondary | ICD-10-CM | POA: Diagnosis not present

## 2019-01-21 DIAGNOSIS — S91301A Unspecified open wound, right foot, initial encounter: Secondary | ICD-10-CM | POA: Diagnosis not present

## 2019-01-21 DIAGNOSIS — Z888 Allergy status to other drugs, medicaments and biological substances status: Secondary | ICD-10-CM | POA: Diagnosis not present

## 2019-01-21 DIAGNOSIS — Z882 Allergy status to sulfonamides status: Secondary | ICD-10-CM | POA: Diagnosis not present

## 2019-01-27 DIAGNOSIS — J441 Chronic obstructive pulmonary disease with (acute) exacerbation: Secondary | ICD-10-CM | POA: Diagnosis not present

## 2019-01-27 DIAGNOSIS — M150 Primary generalized (osteo)arthritis: Secondary | ICD-10-CM | POA: Diagnosis not present

## 2019-01-27 DIAGNOSIS — M868X7 Other osteomyelitis, ankle and foot: Secondary | ICD-10-CM | POA: Diagnosis not present

## 2019-01-27 DIAGNOSIS — I270 Primary pulmonary hypertension: Secondary | ICD-10-CM | POA: Diagnosis not present

## 2019-01-27 DIAGNOSIS — L97519 Non-pressure chronic ulcer of other part of right foot with unspecified severity: Secondary | ICD-10-CM | POA: Diagnosis not present

## 2019-01-27 DIAGNOSIS — J189 Pneumonia, unspecified organism: Secondary | ICD-10-CM | POA: Diagnosis not present

## 2019-01-27 DIAGNOSIS — M869 Osteomyelitis, unspecified: Secondary | ICD-10-CM | POA: Diagnosis not present

## 2019-01-27 DIAGNOSIS — L97518 Non-pressure chronic ulcer of other part of right foot with other specified severity: Secondary | ICD-10-CM | POA: Diagnosis not present

## 2019-02-03 DIAGNOSIS — L97519 Non-pressure chronic ulcer of other part of right foot with unspecified severity: Secondary | ICD-10-CM | POA: Diagnosis not present

## 2019-02-03 DIAGNOSIS — M869 Osteomyelitis, unspecified: Secondary | ICD-10-CM | POA: Diagnosis not present

## 2019-02-25 DIAGNOSIS — R69 Illness, unspecified: Secondary | ICD-10-CM | POA: Diagnosis not present

## 2019-02-25 DIAGNOSIS — Z881 Allergy status to other antibiotic agents status: Secondary | ICD-10-CM | POA: Diagnosis not present

## 2019-02-25 DIAGNOSIS — R509 Fever, unspecified: Secondary | ICD-10-CM | POA: Diagnosis not present

## 2019-02-25 DIAGNOSIS — Z885 Allergy status to narcotic agent status: Secondary | ICD-10-CM | POA: Diagnosis not present

## 2019-02-25 DIAGNOSIS — I10 Essential (primary) hypertension: Secondary | ICD-10-CM | POA: Diagnosis not present

## 2019-02-25 DIAGNOSIS — Z882 Allergy status to sulfonamides status: Secondary | ICD-10-CM | POA: Diagnosis not present

## 2019-02-25 DIAGNOSIS — R319 Hematuria, unspecified: Secondary | ICD-10-CM | POA: Diagnosis not present

## 2019-02-25 DIAGNOSIS — Z79899 Other long term (current) drug therapy: Secondary | ICD-10-CM | POA: Diagnosis not present

## 2019-02-25 DIAGNOSIS — M868X7 Other osteomyelitis, ankle and foot: Secondary | ICD-10-CM | POA: Diagnosis not present

## 2019-02-26 DIAGNOSIS — J441 Chronic obstructive pulmonary disease with (acute) exacerbation: Secondary | ICD-10-CM | POA: Diagnosis not present

## 2019-02-26 DIAGNOSIS — M150 Primary generalized (osteo)arthritis: Secondary | ICD-10-CM | POA: Diagnosis not present

## 2019-02-26 DIAGNOSIS — I270 Primary pulmonary hypertension: Secondary | ICD-10-CM | POA: Diagnosis not present

## 2019-02-26 DIAGNOSIS — J189 Pneumonia, unspecified organism: Secondary | ICD-10-CM | POA: Diagnosis not present

## 2019-03-03 DIAGNOSIS — L98498 Non-pressure chronic ulcer of skin of other sites with other specified severity: Secondary | ICD-10-CM | POA: Diagnosis not present

## 2019-03-03 DIAGNOSIS — L97519 Non-pressure chronic ulcer of other part of right foot with unspecified severity: Secondary | ICD-10-CM | POA: Diagnosis not present

## 2019-03-03 DIAGNOSIS — L97518 Non-pressure chronic ulcer of other part of right foot with other specified severity: Secondary | ICD-10-CM | POA: Diagnosis not present

## 2019-03-03 DIAGNOSIS — M868X7 Other osteomyelitis, ankle and foot: Secondary | ICD-10-CM | POA: Diagnosis not present

## 2019-03-03 DIAGNOSIS — M869 Osteomyelitis, unspecified: Secondary | ICD-10-CM | POA: Diagnosis not present

## 2019-03-05 DIAGNOSIS — E7800 Pure hypercholesterolemia, unspecified: Secondary | ICD-10-CM | POA: Diagnosis not present

## 2019-03-05 DIAGNOSIS — Z1331 Encounter for screening for depression: Secondary | ICD-10-CM | POA: Diagnosis not present

## 2019-03-05 DIAGNOSIS — Z79899 Other long term (current) drug therapy: Secondary | ICD-10-CM | POA: Diagnosis not present

## 2019-03-05 DIAGNOSIS — R69 Illness, unspecified: Secondary | ICD-10-CM | POA: Diagnosis not present

## 2019-03-05 DIAGNOSIS — Z6826 Body mass index (BMI) 26.0-26.9, adult: Secondary | ICD-10-CM | POA: Diagnosis not present

## 2019-03-05 DIAGNOSIS — M86271 Subacute osteomyelitis, right ankle and foot: Secondary | ICD-10-CM | POA: Diagnosis not present

## 2019-03-05 DIAGNOSIS — G309 Alzheimer's disease, unspecified: Secondary | ICD-10-CM | POA: Diagnosis not present

## 2019-03-05 DIAGNOSIS — Z1339 Encounter for screening examination for other mental health and behavioral disorders: Secondary | ICD-10-CM | POA: Diagnosis not present

## 2019-03-05 DIAGNOSIS — I10 Essential (primary) hypertension: Secondary | ICD-10-CM | POA: Diagnosis not present

## 2019-03-05 DIAGNOSIS — Z299 Encounter for prophylactic measures, unspecified: Secondary | ICD-10-CM | POA: Diagnosis not present

## 2019-03-05 DIAGNOSIS — Z0000 Encounter for general adult medical examination without abnormal findings: Secondary | ICD-10-CM | POA: Diagnosis not present

## 2019-03-05 DIAGNOSIS — R5383 Other fatigue: Secondary | ICD-10-CM | POA: Diagnosis not present

## 2019-03-05 DIAGNOSIS — Z7189 Other specified counseling: Secondary | ICD-10-CM | POA: Diagnosis not present

## 2019-03-17 DIAGNOSIS — L98498 Non-pressure chronic ulcer of skin of other sites with other specified severity: Secondary | ICD-10-CM | POA: Diagnosis not present

## 2019-03-17 DIAGNOSIS — L98499 Non-pressure chronic ulcer of skin of other sites with unspecified severity: Secondary | ICD-10-CM | POA: Diagnosis not present

## 2019-03-17 DIAGNOSIS — L97518 Non-pressure chronic ulcer of other part of right foot with other specified severity: Secondary | ICD-10-CM | POA: Diagnosis not present

## 2019-03-17 DIAGNOSIS — L97519 Non-pressure chronic ulcer of other part of right foot with unspecified severity: Secondary | ICD-10-CM | POA: Diagnosis not present

## 2019-03-17 DIAGNOSIS — M868X7 Other osteomyelitis, ankle and foot: Secondary | ICD-10-CM | POA: Diagnosis not present

## 2019-03-29 DIAGNOSIS — J189 Pneumonia, unspecified organism: Secondary | ICD-10-CM | POA: Diagnosis not present

## 2019-03-29 DIAGNOSIS — M150 Primary generalized (osteo)arthritis: Secondary | ICD-10-CM | POA: Diagnosis not present

## 2019-03-29 DIAGNOSIS — J441 Chronic obstructive pulmonary disease with (acute) exacerbation: Secondary | ICD-10-CM | POA: Diagnosis not present

## 2019-03-29 DIAGNOSIS — I270 Primary pulmonary hypertension: Secondary | ICD-10-CM | POA: Diagnosis not present

## 2019-03-31 DIAGNOSIS — L89899 Pressure ulcer of other site, unspecified stage: Secondary | ICD-10-CM | POA: Diagnosis not present

## 2019-03-31 DIAGNOSIS — M869 Osteomyelitis, unspecified: Secondary | ICD-10-CM | POA: Diagnosis not present

## 2019-03-31 DIAGNOSIS — L89152 Pressure ulcer of sacral region, stage 2: Secondary | ICD-10-CM | POA: Diagnosis not present

## 2019-03-31 DIAGNOSIS — L97519 Non-pressure chronic ulcer of other part of right foot with unspecified severity: Secondary | ICD-10-CM | POA: Diagnosis not present

## 2019-04-02 DIAGNOSIS — K626 Ulcer of anus and rectum: Secondary | ICD-10-CM | POA: Diagnosis not present

## 2019-04-03 DIAGNOSIS — K626 Ulcer of anus and rectum: Secondary | ICD-10-CM | POA: Diagnosis not present

## 2019-04-14 DIAGNOSIS — L89899 Pressure ulcer of other site, unspecified stage: Secondary | ICD-10-CM | POA: Diagnosis not present

## 2019-04-16 DIAGNOSIS — Z299 Encounter for prophylactic measures, unspecified: Secondary | ICD-10-CM | POA: Diagnosis not present

## 2019-04-16 DIAGNOSIS — R69 Illness, unspecified: Secondary | ICD-10-CM | POA: Diagnosis not present

## 2019-04-16 DIAGNOSIS — G309 Alzheimer's disease, unspecified: Secondary | ICD-10-CM | POA: Diagnosis not present

## 2019-04-16 DIAGNOSIS — I272 Pulmonary hypertension, unspecified: Secondary | ICD-10-CM | POA: Diagnosis not present

## 2019-04-16 DIAGNOSIS — Z6824 Body mass index (BMI) 24.0-24.9, adult: Secondary | ICD-10-CM | POA: Diagnosis not present

## 2019-04-16 DIAGNOSIS — M86171 Other acute osteomyelitis, right ankle and foot: Secondary | ICD-10-CM | POA: Diagnosis not present

## 2019-04-23 DIAGNOSIS — Z299 Encounter for prophylactic measures, unspecified: Secondary | ICD-10-CM | POA: Diagnosis not present

## 2019-04-23 DIAGNOSIS — Z6824 Body mass index (BMI) 24.0-24.9, adult: Secondary | ICD-10-CM | POA: Diagnosis not present

## 2019-04-23 DIAGNOSIS — R69 Illness, unspecified: Secondary | ICD-10-CM | POA: Diagnosis not present

## 2019-04-23 DIAGNOSIS — G309 Alzheimer's disease, unspecified: Secondary | ICD-10-CM | POA: Diagnosis not present

## 2019-04-23 DIAGNOSIS — L89309 Pressure ulcer of unspecified buttock, unspecified stage: Secondary | ICD-10-CM | POA: Diagnosis not present

## 2019-04-24 DIAGNOSIS — K626 Ulcer of anus and rectum: Secondary | ICD-10-CM | POA: Diagnosis not present

## 2019-04-29 DIAGNOSIS — I270 Primary pulmonary hypertension: Secondary | ICD-10-CM | POA: Diagnosis not present

## 2019-04-29 DIAGNOSIS — L89302 Pressure ulcer of unspecified buttock, stage 2: Secondary | ICD-10-CM | POA: Diagnosis not present

## 2019-04-29 DIAGNOSIS — J189 Pneumonia, unspecified organism: Secondary | ICD-10-CM | POA: Diagnosis not present

## 2019-04-29 DIAGNOSIS — M150 Primary generalized (osteo)arthritis: Secondary | ICD-10-CM | POA: Diagnosis not present

## 2019-04-29 DIAGNOSIS — J441 Chronic obstructive pulmonary disease with (acute) exacerbation: Secondary | ICD-10-CM | POA: Diagnosis not present

## 2019-05-08 DIAGNOSIS — M25431 Effusion, right wrist: Secondary | ICD-10-CM | POA: Diagnosis not present

## 2019-05-08 DIAGNOSIS — Z6824 Body mass index (BMI) 24.0-24.9, adult: Secondary | ICD-10-CM | POA: Diagnosis not present

## 2019-05-08 DIAGNOSIS — R69 Illness, unspecified: Secondary | ICD-10-CM | POA: Diagnosis not present

## 2019-05-08 DIAGNOSIS — I10 Essential (primary) hypertension: Secondary | ICD-10-CM | POA: Diagnosis not present

## 2019-05-08 DIAGNOSIS — Z299 Encounter for prophylactic measures, unspecified: Secondary | ICD-10-CM | POA: Diagnosis not present

## 2019-05-08 DIAGNOSIS — M25531 Pain in right wrist: Secondary | ICD-10-CM | POA: Diagnosis not present

## 2019-05-08 DIAGNOSIS — G309 Alzheimer's disease, unspecified: Secondary | ICD-10-CM | POA: Diagnosis not present

## 2019-05-12 DIAGNOSIS — L89899 Pressure ulcer of other site, unspecified stage: Secondary | ICD-10-CM | POA: Diagnosis not present

## 2019-05-26 DIAGNOSIS — L89899 Pressure ulcer of other site, unspecified stage: Secondary | ICD-10-CM | POA: Diagnosis not present

## 2019-05-29 DIAGNOSIS — M150 Primary generalized (osteo)arthritis: Secondary | ICD-10-CM | POA: Diagnosis not present

## 2019-05-29 DIAGNOSIS — J441 Chronic obstructive pulmonary disease with (acute) exacerbation: Secondary | ICD-10-CM | POA: Diagnosis not present

## 2019-05-29 DIAGNOSIS — J189 Pneumonia, unspecified organism: Secondary | ICD-10-CM | POA: Diagnosis not present

## 2019-05-29 DIAGNOSIS — I270 Primary pulmonary hypertension: Secondary | ICD-10-CM | POA: Diagnosis not present

## 2019-06-04 DIAGNOSIS — G309 Alzheimer's disease, unspecified: Secondary | ICD-10-CM | POA: Diagnosis not present

## 2019-06-04 DIAGNOSIS — Z6824 Body mass index (BMI) 24.0-24.9, adult: Secondary | ICD-10-CM | POA: Diagnosis not present

## 2019-06-04 DIAGNOSIS — M545 Low back pain: Secondary | ICD-10-CM | POA: Diagnosis not present

## 2019-06-04 DIAGNOSIS — R69 Illness, unspecified: Secondary | ICD-10-CM | POA: Diagnosis not present

## 2019-06-04 DIAGNOSIS — Z299 Encounter for prophylactic measures, unspecified: Secondary | ICD-10-CM | POA: Diagnosis not present

## 2019-06-04 DIAGNOSIS — N390 Urinary tract infection, site not specified: Secondary | ICD-10-CM | POA: Diagnosis not present

## 2019-06-10 DIAGNOSIS — L03115 Cellulitis of right lower limb: Secondary | ICD-10-CM | POA: Diagnosis not present

## 2019-06-10 DIAGNOSIS — R69 Illness, unspecified: Secondary | ICD-10-CM | POA: Diagnosis not present

## 2019-06-10 DIAGNOSIS — S91104A Unspecified open wound of right lesser toe(s) without damage to nail, initial encounter: Secondary | ICD-10-CM | POA: Diagnosis not present

## 2019-06-10 DIAGNOSIS — M85871 Other specified disorders of bone density and structure, right ankle and foot: Secondary | ICD-10-CM | POA: Diagnosis not present

## 2019-06-10 DIAGNOSIS — Z882 Allergy status to sulfonamides status: Secondary | ICD-10-CM | POA: Diagnosis not present

## 2019-06-10 DIAGNOSIS — Z881 Allergy status to other antibiotic agents status: Secondary | ICD-10-CM | POA: Diagnosis not present

## 2019-06-10 DIAGNOSIS — G629 Polyneuropathy, unspecified: Secondary | ICD-10-CM | POA: Diagnosis not present

## 2019-06-10 DIAGNOSIS — S91301A Unspecified open wound, right foot, initial encounter: Secondary | ICD-10-CM | POA: Diagnosis not present

## 2019-06-10 DIAGNOSIS — Z79899 Other long term (current) drug therapy: Secondary | ICD-10-CM | POA: Diagnosis not present

## 2019-06-10 DIAGNOSIS — Z885 Allergy status to narcotic agent status: Secondary | ICD-10-CM | POA: Diagnosis not present

## 2019-06-10 DIAGNOSIS — E7800 Pure hypercholesterolemia, unspecified: Secondary | ICD-10-CM | POA: Diagnosis not present

## 2019-06-10 DIAGNOSIS — I10 Essential (primary) hypertension: Secondary | ICD-10-CM | POA: Diagnosis not present

## 2019-06-16 DIAGNOSIS — L89899 Pressure ulcer of other site, unspecified stage: Secondary | ICD-10-CM | POA: Diagnosis not present

## 2019-06-16 DIAGNOSIS — M869 Osteomyelitis, unspecified: Secondary | ICD-10-CM | POA: Diagnosis not present

## 2019-06-16 DIAGNOSIS — Z872 Personal history of diseases of the skin and subcutaneous tissue: Secondary | ICD-10-CM | POA: Diagnosis not present

## 2019-06-29 DIAGNOSIS — M150 Primary generalized (osteo)arthritis: Secondary | ICD-10-CM | POA: Diagnosis not present

## 2019-06-29 DIAGNOSIS — I270 Primary pulmonary hypertension: Secondary | ICD-10-CM | POA: Diagnosis not present

## 2019-06-29 DIAGNOSIS — J189 Pneumonia, unspecified organism: Secondary | ICD-10-CM | POA: Diagnosis not present

## 2019-06-29 DIAGNOSIS — J441 Chronic obstructive pulmonary disease with (acute) exacerbation: Secondary | ICD-10-CM | POA: Diagnosis not present

## 2019-07-06 DIAGNOSIS — R69 Illness, unspecified: Secondary | ICD-10-CM | POA: Diagnosis not present

## 2019-07-07 DIAGNOSIS — G309 Alzheimer's disease, unspecified: Secondary | ICD-10-CM | POA: Diagnosis not present

## 2019-07-07 DIAGNOSIS — R69 Illness, unspecified: Secondary | ICD-10-CM | POA: Diagnosis not present

## 2019-07-07 DIAGNOSIS — Z950 Presence of cardiac pacemaker: Secondary | ICD-10-CM | POA: Diagnosis not present

## 2019-07-07 DIAGNOSIS — M869 Osteomyelitis, unspecified: Secondary | ICD-10-CM | POA: Diagnosis not present

## 2019-07-07 DIAGNOSIS — Z6824 Body mass index (BMI) 24.0-24.9, adult: Secondary | ICD-10-CM | POA: Diagnosis not present

## 2019-07-07 DIAGNOSIS — Z299 Encounter for prophylactic measures, unspecified: Secondary | ICD-10-CM | POA: Diagnosis not present

## 2019-07-29 DIAGNOSIS — J189 Pneumonia, unspecified organism: Secondary | ICD-10-CM | POA: Diagnosis not present

## 2019-07-29 DIAGNOSIS — J441 Chronic obstructive pulmonary disease with (acute) exacerbation: Secondary | ICD-10-CM | POA: Diagnosis not present

## 2019-07-29 DIAGNOSIS — M150 Primary generalized (osteo)arthritis: Secondary | ICD-10-CM | POA: Diagnosis not present

## 2019-07-29 DIAGNOSIS — I270 Primary pulmonary hypertension: Secondary | ICD-10-CM | POA: Diagnosis not present

## 2019-08-04 DIAGNOSIS — N390 Urinary tract infection, site not specified: Secondary | ICD-10-CM | POA: Diagnosis not present

## 2019-08-04 DIAGNOSIS — Z299 Encounter for prophylactic measures, unspecified: Secondary | ICD-10-CM | POA: Diagnosis not present

## 2019-08-04 DIAGNOSIS — Z6824 Body mass index (BMI) 24.0-24.9, adult: Secondary | ICD-10-CM | POA: Diagnosis not present

## 2019-08-04 DIAGNOSIS — Z713 Dietary counseling and surveillance: Secondary | ICD-10-CM | POA: Diagnosis not present

## 2019-08-04 DIAGNOSIS — I10 Essential (primary) hypertension: Secondary | ICD-10-CM | POA: Diagnosis not present

## 2019-08-04 DIAGNOSIS — R300 Dysuria: Secondary | ICD-10-CM | POA: Diagnosis not present

## 2019-08-19 DIAGNOSIS — L89322 Pressure ulcer of left buttock, stage 2: Secondary | ICD-10-CM | POA: Diagnosis not present

## 2019-08-19 DIAGNOSIS — G309 Alzheimer's disease, unspecified: Secondary | ICD-10-CM | POA: Diagnosis not present

## 2019-08-19 DIAGNOSIS — Z299 Encounter for prophylactic measures, unspecified: Secondary | ICD-10-CM | POA: Diagnosis not present

## 2019-08-19 DIAGNOSIS — R69 Illness, unspecified: Secondary | ICD-10-CM | POA: Diagnosis not present

## 2019-08-19 DIAGNOSIS — I10 Essential (primary) hypertension: Secondary | ICD-10-CM | POA: Diagnosis not present

## 2019-08-19 DIAGNOSIS — Z6824 Body mass index (BMI) 24.0-24.9, adult: Secondary | ICD-10-CM | POA: Diagnosis not present

## 2019-08-29 DIAGNOSIS — J441 Chronic obstructive pulmonary disease with (acute) exacerbation: Secondary | ICD-10-CM | POA: Diagnosis not present

## 2019-08-29 DIAGNOSIS — J189 Pneumonia, unspecified organism: Secondary | ICD-10-CM | POA: Diagnosis not present

## 2019-08-29 DIAGNOSIS — I270 Primary pulmonary hypertension: Secondary | ICD-10-CM | POA: Diagnosis not present

## 2019-08-29 DIAGNOSIS — M150 Primary generalized (osteo)arthritis: Secondary | ICD-10-CM | POA: Diagnosis not present

## 2019-09-25 DIAGNOSIS — R69 Illness, unspecified: Secondary | ICD-10-CM | POA: Diagnosis not present

## 2019-09-25 DIAGNOSIS — G309 Alzheimer's disease, unspecified: Secondary | ICD-10-CM | POA: Diagnosis not present

## 2019-09-25 DIAGNOSIS — L98499 Non-pressure chronic ulcer of skin of other sites with unspecified severity: Secondary | ICD-10-CM | POA: Diagnosis not present

## 2019-09-29 DIAGNOSIS — J441 Chronic obstructive pulmonary disease with (acute) exacerbation: Secondary | ICD-10-CM | POA: Diagnosis not present

## 2019-09-29 DIAGNOSIS — J189 Pneumonia, unspecified organism: Secondary | ICD-10-CM | POA: Diagnosis not present

## 2019-09-29 DIAGNOSIS — I270 Primary pulmonary hypertension: Secondary | ICD-10-CM | POA: Diagnosis not present

## 2019-09-29 DIAGNOSIS — M150 Primary generalized (osteo)arthritis: Secondary | ICD-10-CM | POA: Diagnosis not present

## 2019-10-16 DIAGNOSIS — Z299 Encounter for prophylactic measures, unspecified: Secondary | ICD-10-CM | POA: Diagnosis not present

## 2019-10-16 DIAGNOSIS — G309 Alzheimer's disease, unspecified: Secondary | ICD-10-CM | POA: Diagnosis not present

## 2019-10-16 DIAGNOSIS — J690 Pneumonitis due to inhalation of food and vomit: Secondary | ICD-10-CM | POA: Diagnosis not present

## 2019-10-16 DIAGNOSIS — L98499 Non-pressure chronic ulcer of skin of other sites with unspecified severity: Secondary | ICD-10-CM | POA: Diagnosis not present

## 2019-10-16 DIAGNOSIS — R69 Illness, unspecified: Secondary | ICD-10-CM | POA: Diagnosis not present

## 2019-10-20 DIAGNOSIS — R69 Illness, unspecified: Secondary | ICD-10-CM | POA: Diagnosis not present

## 2019-10-20 DIAGNOSIS — M810 Age-related osteoporosis without current pathological fracture: Secondary | ICD-10-CM | POA: Diagnosis not present

## 2019-10-20 DIAGNOSIS — I10 Essential (primary) hypertension: Secondary | ICD-10-CM | POA: Diagnosis not present

## 2019-10-20 DIAGNOSIS — G629 Polyneuropathy, unspecified: Secondary | ICD-10-CM | POA: Diagnosis not present

## 2019-10-20 DIAGNOSIS — M7989 Other specified soft tissue disorders: Secondary | ICD-10-CM | POA: Diagnosis not present

## 2019-10-20 DIAGNOSIS — Z79899 Other long term (current) drug therapy: Secondary | ICD-10-CM | POA: Diagnosis not present

## 2019-10-20 DIAGNOSIS — Z885 Allergy status to narcotic agent status: Secondary | ICD-10-CM | POA: Diagnosis not present

## 2019-10-20 DIAGNOSIS — Z882 Allergy status to sulfonamides status: Secondary | ICD-10-CM | POA: Diagnosis not present

## 2019-10-20 DIAGNOSIS — L97319 Non-pressure chronic ulcer of right ankle with unspecified severity: Secondary | ICD-10-CM | POA: Diagnosis not present

## 2019-10-20 DIAGNOSIS — Z881 Allergy status to other antibiotic agents status: Secondary | ICD-10-CM | POA: Diagnosis not present

## 2019-10-20 DIAGNOSIS — E7800 Pure hypercholesterolemia, unspecified: Secondary | ICD-10-CM | POA: Diagnosis not present

## 2019-10-27 DIAGNOSIS — J441 Chronic obstructive pulmonary disease with (acute) exacerbation: Secondary | ICD-10-CM | POA: Diagnosis not present

## 2019-10-27 DIAGNOSIS — J189 Pneumonia, unspecified organism: Secondary | ICD-10-CM | POA: Diagnosis not present

## 2019-10-27 DIAGNOSIS — I270 Primary pulmonary hypertension: Secondary | ICD-10-CM | POA: Diagnosis not present

## 2019-10-27 DIAGNOSIS — M150 Primary generalized (osteo)arthritis: Secondary | ICD-10-CM | POA: Diagnosis not present

## 2019-10-30 DIAGNOSIS — Z01818 Encounter for other preprocedural examination: Secondary | ICD-10-CM | POA: Diagnosis not present

## 2019-10-30 DIAGNOSIS — T847XXA Infection and inflammatory reaction due to other internal orthopedic prosthetic devices, implants and grafts, initial encounter: Secondary | ICD-10-CM | POA: Diagnosis not present

## 2019-11-03 DIAGNOSIS — Y838 Other surgical procedures as the cause of abnormal reaction of the patient, or of later complication, without mention of misadventure at the time of the procedure: Secondary | ICD-10-CM | POA: Diagnosis not present

## 2019-11-03 DIAGNOSIS — M810 Age-related osteoporosis without current pathological fracture: Secondary | ICD-10-CM | POA: Diagnosis not present

## 2019-11-03 DIAGNOSIS — T8469XA Infection and inflammatory reaction due to internal fixation device of other site, initial encounter: Secondary | ICD-10-CM | POA: Diagnosis not present

## 2019-11-03 DIAGNOSIS — R69 Illness, unspecified: Secondary | ICD-10-CM | POA: Diagnosis not present

## 2019-11-03 DIAGNOSIS — I10 Essential (primary) hypertension: Secondary | ICD-10-CM | POA: Diagnosis not present

## 2019-11-03 DIAGNOSIS — T8484XA Pain due to internal orthopedic prosthetic devices, implants and grafts, initial encounter: Secondary | ICD-10-CM | POA: Diagnosis not present

## 2019-11-03 DIAGNOSIS — M1990 Unspecified osteoarthritis, unspecified site: Secondary | ICD-10-CM | POA: Diagnosis not present

## 2019-11-03 DIAGNOSIS — E785 Hyperlipidemia, unspecified: Secondary | ICD-10-CM | POA: Diagnosis not present

## 2019-11-03 DIAGNOSIS — Z96651 Presence of right artificial knee joint: Secondary | ICD-10-CM | POA: Diagnosis not present

## 2019-11-03 DIAGNOSIS — Z950 Presence of cardiac pacemaker: Secondary | ICD-10-CM | POA: Diagnosis not present

## 2019-11-03 DIAGNOSIS — Z472 Encounter for removal of internal fixation device: Secondary | ICD-10-CM | POA: Diagnosis not present

## 2019-11-03 DIAGNOSIS — T847XXA Infection and inflammatory reaction due to other internal orthopedic prosthetic devices, implants and grafts, initial encounter: Secondary | ICD-10-CM | POA: Diagnosis not present

## 2019-11-27 DIAGNOSIS — I270 Primary pulmonary hypertension: Secondary | ICD-10-CM | POA: Diagnosis not present

## 2019-11-27 DIAGNOSIS — J189 Pneumonia, unspecified organism: Secondary | ICD-10-CM | POA: Diagnosis not present

## 2019-11-27 DIAGNOSIS — J441 Chronic obstructive pulmonary disease with (acute) exacerbation: Secondary | ICD-10-CM | POA: Diagnosis not present

## 2019-11-27 DIAGNOSIS — M150 Primary generalized (osteo)arthritis: Secondary | ICD-10-CM | POA: Diagnosis not present

## 2019-12-03 DIAGNOSIS — R69 Illness, unspecified: Secondary | ICD-10-CM | POA: Diagnosis not present

## 2019-12-03 DIAGNOSIS — N390 Urinary tract infection, site not specified: Secondary | ICD-10-CM | POA: Diagnosis not present

## 2019-12-27 DIAGNOSIS — J189 Pneumonia, unspecified organism: Secondary | ICD-10-CM | POA: Diagnosis not present

## 2019-12-27 DIAGNOSIS — M150 Primary generalized (osteo)arthritis: Secondary | ICD-10-CM | POA: Diagnosis not present

## 2019-12-27 DIAGNOSIS — I270 Primary pulmonary hypertension: Secondary | ICD-10-CM | POA: Diagnosis not present

## 2019-12-27 DIAGNOSIS — J441 Chronic obstructive pulmonary disease with (acute) exacerbation: Secondary | ICD-10-CM | POA: Diagnosis not present

## 2020-01-27 DIAGNOSIS — J189 Pneumonia, unspecified organism: Secondary | ICD-10-CM | POA: Diagnosis not present

## 2020-01-27 DIAGNOSIS — M150 Primary generalized (osteo)arthritis: Secondary | ICD-10-CM | POA: Diagnosis not present

## 2020-01-27 DIAGNOSIS — J441 Chronic obstructive pulmonary disease with (acute) exacerbation: Secondary | ICD-10-CM | POA: Diagnosis not present

## 2020-01-27 DIAGNOSIS — I270 Primary pulmonary hypertension: Secondary | ICD-10-CM | POA: Diagnosis not present

## 2020-01-28 DIAGNOSIS — N8110 Cystocele, unspecified: Secondary | ICD-10-CM | POA: Diagnosis not present

## 2020-01-28 DIAGNOSIS — D692 Other nonthrombocytopenic purpura: Secondary | ICD-10-CM | POA: Diagnosis not present

## 2020-01-28 DIAGNOSIS — N390 Urinary tract infection, site not specified: Secondary | ICD-10-CM | POA: Diagnosis not present

## 2020-01-28 DIAGNOSIS — Z299 Encounter for prophylactic measures, unspecified: Secondary | ICD-10-CM | POA: Diagnosis not present

## 2020-01-28 DIAGNOSIS — G309 Alzheimer's disease, unspecified: Secondary | ICD-10-CM | POA: Diagnosis not present

## 2020-02-02 DIAGNOSIS — N8110 Cystocele, unspecified: Secondary | ICD-10-CM | POA: Diagnosis not present

## 2020-02-02 DIAGNOSIS — R69 Illness, unspecified: Secondary | ICD-10-CM | POA: Diagnosis not present

## 2020-02-02 DIAGNOSIS — Z299 Encounter for prophylactic measures, unspecified: Secondary | ICD-10-CM | POA: Diagnosis not present

## 2020-02-02 DIAGNOSIS — G309 Alzheimer's disease, unspecified: Secondary | ICD-10-CM | POA: Diagnosis not present

## 2020-02-02 DIAGNOSIS — I10 Essential (primary) hypertension: Secondary | ICD-10-CM | POA: Diagnosis not present

## 2020-02-26 DIAGNOSIS — I270 Primary pulmonary hypertension: Secondary | ICD-10-CM | POA: Diagnosis not present

## 2020-02-26 DIAGNOSIS — M150 Primary generalized (osteo)arthritis: Secondary | ICD-10-CM | POA: Diagnosis not present

## 2020-02-26 DIAGNOSIS — J189 Pneumonia, unspecified organism: Secondary | ICD-10-CM | POA: Diagnosis not present

## 2020-02-26 DIAGNOSIS — J441 Chronic obstructive pulmonary disease with (acute) exacerbation: Secondary | ICD-10-CM | POA: Diagnosis not present

## 2020-03-02 DIAGNOSIS — G309 Alzheimer's disease, unspecified: Secondary | ICD-10-CM | POA: Diagnosis not present

## 2020-03-02 DIAGNOSIS — Z6824 Body mass index (BMI) 24.0-24.9, adult: Secondary | ICD-10-CM | POA: Diagnosis not present

## 2020-03-02 DIAGNOSIS — R69 Illness, unspecified: Secondary | ICD-10-CM | POA: Diagnosis not present

## 2020-03-02 DIAGNOSIS — Z299 Encounter for prophylactic measures, unspecified: Secondary | ICD-10-CM | POA: Diagnosis not present

## 2020-03-02 DIAGNOSIS — N8110 Cystocele, unspecified: Secondary | ICD-10-CM | POA: Diagnosis not present

## 2020-03-02 DIAGNOSIS — R300 Dysuria: Secondary | ICD-10-CM | POA: Diagnosis not present

## 2020-03-08 DIAGNOSIS — Z1339 Encounter for screening examination for other mental health and behavioral disorders: Secondary | ICD-10-CM | POA: Diagnosis not present

## 2020-03-08 DIAGNOSIS — Z0000 Encounter for general adult medical examination without abnormal findings: Secondary | ICD-10-CM | POA: Diagnosis not present

## 2020-03-08 DIAGNOSIS — R69 Illness, unspecified: Secondary | ICD-10-CM | POA: Diagnosis not present

## 2020-03-08 DIAGNOSIS — Z6824 Body mass index (BMI) 24.0-24.9, adult: Secondary | ICD-10-CM | POA: Diagnosis not present

## 2020-03-08 DIAGNOSIS — R5383 Other fatigue: Secondary | ICD-10-CM | POA: Diagnosis not present

## 2020-03-08 DIAGNOSIS — Z7189 Other specified counseling: Secondary | ICD-10-CM | POA: Diagnosis not present

## 2020-03-08 DIAGNOSIS — E7800 Pure hypercholesterolemia, unspecified: Secondary | ICD-10-CM | POA: Diagnosis not present

## 2020-03-08 DIAGNOSIS — Z79899 Other long term (current) drug therapy: Secondary | ICD-10-CM | POA: Diagnosis not present

## 2020-03-08 DIAGNOSIS — Z299 Encounter for prophylactic measures, unspecified: Secondary | ICD-10-CM | POA: Diagnosis not present

## 2020-03-08 DIAGNOSIS — Z1331 Encounter for screening for depression: Secondary | ICD-10-CM | POA: Diagnosis not present

## 2020-03-17 DIAGNOSIS — L98499 Non-pressure chronic ulcer of skin of other sites with unspecified severity: Secondary | ICD-10-CM | POA: Diagnosis not present

## 2020-03-17 DIAGNOSIS — G309 Alzheimer's disease, unspecified: Secondary | ICD-10-CM | POA: Diagnosis not present

## 2020-03-17 DIAGNOSIS — Z299 Encounter for prophylactic measures, unspecified: Secondary | ICD-10-CM | POA: Diagnosis not present

## 2020-03-17 DIAGNOSIS — R69 Illness, unspecified: Secondary | ICD-10-CM | POA: Diagnosis not present

## 2020-03-17 DIAGNOSIS — Z6824 Body mass index (BMI) 24.0-24.9, adult: Secondary | ICD-10-CM | POA: Diagnosis not present

## 2020-03-28 DIAGNOSIS — J189 Pneumonia, unspecified organism: Secondary | ICD-10-CM | POA: Diagnosis not present

## 2020-03-28 DIAGNOSIS — J441 Chronic obstructive pulmonary disease with (acute) exacerbation: Secondary | ICD-10-CM | POA: Diagnosis not present

## 2020-03-28 DIAGNOSIS — M150 Primary generalized (osteo)arthritis: Secondary | ICD-10-CM | POA: Diagnosis not present

## 2020-03-28 DIAGNOSIS — I270 Primary pulmonary hypertension: Secondary | ICD-10-CM | POA: Diagnosis not present

## 2020-04-08 DIAGNOSIS — G309 Alzheimer's disease, unspecified: Secondary | ICD-10-CM | POA: Diagnosis not present

## 2020-04-08 DIAGNOSIS — L89893 Pressure ulcer of other site, stage 3: Secondary | ICD-10-CM | POA: Diagnosis not present

## 2020-04-08 DIAGNOSIS — Z950 Presence of cardiac pacemaker: Secondary | ICD-10-CM | POA: Diagnosis not present

## 2020-04-08 DIAGNOSIS — Z885 Allergy status to narcotic agent status: Secondary | ICD-10-CM | POA: Diagnosis not present

## 2020-04-08 DIAGNOSIS — Z9049 Acquired absence of other specified parts of digestive tract: Secondary | ICD-10-CM | POA: Diagnosis not present

## 2020-04-08 DIAGNOSIS — I10 Essential (primary) hypertension: Secondary | ICD-10-CM | POA: Diagnosis not present

## 2020-04-08 DIAGNOSIS — Z792 Long term (current) use of antibiotics: Secondary | ICD-10-CM | POA: Diagnosis not present

## 2020-04-08 DIAGNOSIS — Z96651 Presence of right artificial knee joint: Secondary | ICD-10-CM | POA: Diagnosis not present

## 2020-04-08 DIAGNOSIS — I739 Peripheral vascular disease, unspecified: Secondary | ICD-10-CM | POA: Diagnosis not present

## 2020-04-28 DIAGNOSIS — I270 Primary pulmonary hypertension: Secondary | ICD-10-CM | POA: Diagnosis not present

## 2020-04-28 DIAGNOSIS — M150 Primary generalized (osteo)arthritis: Secondary | ICD-10-CM | POA: Diagnosis not present

## 2020-04-28 DIAGNOSIS — J189 Pneumonia, unspecified organism: Secondary | ICD-10-CM | POA: Diagnosis not present

## 2020-04-28 DIAGNOSIS — J441 Chronic obstructive pulmonary disease with (acute) exacerbation: Secondary | ICD-10-CM | POA: Diagnosis not present

## 2020-05-06 DIAGNOSIS — L89893 Pressure ulcer of other site, stage 3: Secondary | ICD-10-CM | POA: Diagnosis not present

## 2020-05-28 DIAGNOSIS — J189 Pneumonia, unspecified organism: Secondary | ICD-10-CM | POA: Diagnosis not present

## 2020-05-28 DIAGNOSIS — M150 Primary generalized (osteo)arthritis: Secondary | ICD-10-CM | POA: Diagnosis not present

## 2020-05-28 DIAGNOSIS — J441 Chronic obstructive pulmonary disease with (acute) exacerbation: Secondary | ICD-10-CM | POA: Diagnosis not present

## 2020-05-28 DIAGNOSIS — I270 Primary pulmonary hypertension: Secondary | ICD-10-CM | POA: Diagnosis not present

## 2020-06-17 DIAGNOSIS — Z792 Long term (current) use of antibiotics: Secondary | ICD-10-CM | POA: Diagnosis not present

## 2020-06-17 DIAGNOSIS — I10 Essential (primary) hypertension: Secondary | ICD-10-CM | POA: Diagnosis not present

## 2020-06-17 DIAGNOSIS — G309 Alzheimer's disease, unspecified: Secondary | ICD-10-CM | POA: Diagnosis not present

## 2020-06-17 DIAGNOSIS — E785 Hyperlipidemia, unspecified: Secondary | ICD-10-CM | POA: Diagnosis not present

## 2020-06-17 DIAGNOSIS — L89153 Pressure ulcer of sacral region, stage 3: Secondary | ICD-10-CM | POA: Diagnosis not present

## 2020-06-17 DIAGNOSIS — M810 Age-related osteoporosis without current pathological fracture: Secondary | ICD-10-CM | POA: Diagnosis not present

## 2020-06-17 DIAGNOSIS — Z882 Allergy status to sulfonamides status: Secondary | ICD-10-CM | POA: Diagnosis not present

## 2020-06-17 DIAGNOSIS — Z950 Presence of cardiac pacemaker: Secondary | ICD-10-CM | POA: Diagnosis not present

## 2020-06-17 DIAGNOSIS — R69 Illness, unspecified: Secondary | ICD-10-CM | POA: Diagnosis not present

## 2020-06-17 DIAGNOSIS — Z885 Allergy status to narcotic agent status: Secondary | ICD-10-CM | POA: Diagnosis not present

## 2020-06-22 DIAGNOSIS — S322XXB Fracture of coccyx, initial encounter for open fracture: Secondary | ICD-10-CM | POA: Diagnosis not present

## 2020-06-24 DIAGNOSIS — Z950 Presence of cardiac pacemaker: Secondary | ICD-10-CM | POA: Diagnosis not present

## 2020-06-24 DIAGNOSIS — Z885 Allergy status to narcotic agent status: Secondary | ICD-10-CM | POA: Diagnosis not present

## 2020-06-24 DIAGNOSIS — I739 Peripheral vascular disease, unspecified: Secondary | ICD-10-CM | POA: Diagnosis not present

## 2020-06-24 DIAGNOSIS — R69 Illness, unspecified: Secondary | ICD-10-CM | POA: Diagnosis not present

## 2020-06-24 DIAGNOSIS — L89153 Pressure ulcer of sacral region, stage 3: Secondary | ICD-10-CM | POA: Diagnosis not present

## 2020-06-24 DIAGNOSIS — I10 Essential (primary) hypertension: Secondary | ICD-10-CM | POA: Diagnosis not present

## 2020-06-24 DIAGNOSIS — Z9049 Acquired absence of other specified parts of digestive tract: Secondary | ICD-10-CM | POA: Diagnosis not present

## 2020-06-24 DIAGNOSIS — I272 Pulmonary hypertension, unspecified: Secondary | ICD-10-CM | POA: Diagnosis not present

## 2020-06-24 DIAGNOSIS — Z96651 Presence of right artificial knee joint: Secondary | ICD-10-CM | POA: Diagnosis not present

## 2020-06-24 DIAGNOSIS — Z299 Encounter for prophylactic measures, unspecified: Secondary | ICD-10-CM | POA: Diagnosis not present

## 2020-06-24 DIAGNOSIS — L89154 Pressure ulcer of sacral region, stage 4: Secondary | ICD-10-CM | POA: Diagnosis not present

## 2020-06-24 DIAGNOSIS — G309 Alzheimer's disease, unspecified: Secondary | ICD-10-CM | POA: Diagnosis not present

## 2020-06-28 DIAGNOSIS — J441 Chronic obstructive pulmonary disease with (acute) exacerbation: Secondary | ICD-10-CM | POA: Diagnosis not present

## 2020-06-28 DIAGNOSIS — J189 Pneumonia, unspecified organism: Secondary | ICD-10-CM | POA: Diagnosis not present

## 2020-06-28 DIAGNOSIS — M150 Primary generalized (osteo)arthritis: Secondary | ICD-10-CM | POA: Diagnosis not present

## 2020-06-28 DIAGNOSIS — I270 Primary pulmonary hypertension: Secondary | ICD-10-CM | POA: Diagnosis not present
